# Patient Record
Sex: Female | Born: 1935
Health system: Southern US, Community
[De-identification: ages and names within clinical notes are randomized; demographics above are authoritative.]

## PROBLEM LIST (undated history)

## (undated) DIAGNOSIS — E119 Type 2 diabetes mellitus without complications: Secondary | ICD-10-CM

## (undated) DIAGNOSIS — D649 Anemia, unspecified: Secondary | ICD-10-CM

## (undated) DIAGNOSIS — I10 Essential (primary) hypertension: Secondary | ICD-10-CM

## (undated) DIAGNOSIS — K219 Gastro-esophageal reflux disease without esophagitis: Secondary | ICD-10-CM

## (undated) DIAGNOSIS — E785 Hyperlipidemia, unspecified: Secondary | ICD-10-CM

## (undated) HISTORY — PX: ABDOMINAL HYSTERECTOMY: SHX81

---

## 2011-01-23 DIAGNOSIS — K219 Gastro-esophageal reflux disease without esophagitis: Secondary | ICD-10-CM | POA: Diagnosis not present

## 2011-01-23 DIAGNOSIS — E1159 Type 2 diabetes mellitus with other circulatory complications: Secondary | ICD-10-CM | POA: Diagnosis not present

## 2011-01-23 DIAGNOSIS — I10 Essential (primary) hypertension: Secondary | ICD-10-CM | POA: Diagnosis not present

## 2011-04-24 DIAGNOSIS — E782 Mixed hyperlipidemia: Secondary | ICD-10-CM | POA: Diagnosis not present

## 2011-07-27 DIAGNOSIS — I10 Essential (primary) hypertension: Secondary | ICD-10-CM | POA: Diagnosis not present

## 2011-10-27 DIAGNOSIS — E782 Mixed hyperlipidemia: Secondary | ICD-10-CM | POA: Diagnosis not present

## 2011-10-27 DIAGNOSIS — I10 Essential (primary) hypertension: Secondary | ICD-10-CM | POA: Diagnosis not present

## 2011-10-27 DIAGNOSIS — Z7983 Long term (current) use of bisphosphonates: Secondary | ICD-10-CM | POA: Diagnosis not present

## 2011-10-27 DIAGNOSIS — IMO0001 Reserved for inherently not codable concepts without codable children: Secondary | ICD-10-CM | POA: Diagnosis not present

## 2012-01-29 DIAGNOSIS — I10 Essential (primary) hypertension: Secondary | ICD-10-CM | POA: Diagnosis not present

## 2012-04-29 DIAGNOSIS — I10 Essential (primary) hypertension: Secondary | ICD-10-CM | POA: Diagnosis not present

## 2012-04-29 DIAGNOSIS — Z136 Encounter for screening for cardiovascular disorders: Secondary | ICD-10-CM | POA: Diagnosis not present

## 2012-04-29 DIAGNOSIS — Z0000 Encounter for general adult medical examination without abnormal findings: Secondary | ICD-10-CM | POA: Diagnosis not present

## 2012-07-29 DIAGNOSIS — E1159 Type 2 diabetes mellitus with other circulatory complications: Secondary | ICD-10-CM | POA: Diagnosis not present

## 2012-07-29 DIAGNOSIS — I10 Essential (primary) hypertension: Secondary | ICD-10-CM | POA: Diagnosis not present

## 2012-08-02 DIAGNOSIS — E1100 Type 2 diabetes mellitus with hyperosmolarity without nonketotic hyperglycemic-hyperosmolar coma (NKHHC): Secondary | ICD-10-CM | POA: Diagnosis not present

## 2012-08-02 DIAGNOSIS — Z961 Presence of intraocular lens: Secondary | ICD-10-CM | POA: Diagnosis not present

## 2012-08-02 DIAGNOSIS — H524 Presbyopia: Secondary | ICD-10-CM | POA: Diagnosis not present

## 2012-10-29 DIAGNOSIS — N19 Unspecified kidney failure: Secondary | ICD-10-CM | POA: Diagnosis not present

## 2012-10-29 DIAGNOSIS — I10 Essential (primary) hypertension: Secondary | ICD-10-CM | POA: Diagnosis not present

## 2012-10-29 DIAGNOSIS — Z136 Encounter for screening for cardiovascular disorders: Secondary | ICD-10-CM | POA: Diagnosis not present

## 2013-01-31 DIAGNOSIS — I10 Essential (primary) hypertension: Secondary | ICD-10-CM | POA: Diagnosis not present

## 2013-05-06 DIAGNOSIS — Z0000 Encounter for general adult medical examination without abnormal findings: Secondary | ICD-10-CM | POA: Diagnosis not present

## 2013-05-06 DIAGNOSIS — I10 Essential (primary) hypertension: Secondary | ICD-10-CM | POA: Diagnosis not present

## 2013-05-06 DIAGNOSIS — IMO0001 Reserved for inherently not codable concepts without codable children: Secondary | ICD-10-CM | POA: Diagnosis not present

## 2013-05-06 DIAGNOSIS — A045 Campylobacter enteritis: Secondary | ICD-10-CM | POA: Diagnosis not present

## 2013-08-04 ENCOUNTER — Encounter (HOSPITAL_COMMUNITY): Payer: Self-pay | Admitting: *Deleted

## 2013-08-04 ENCOUNTER — Inpatient Hospital Stay (HOSPITAL_COMMUNITY)
Admission: AD | Admit: 2013-08-04 | Discharge: 2013-08-08 | DRG: 481 | Disposition: A | Discharge status: Skilled Nursing Facility | Payer: Medicare Other | Source: Other Acute Inpatient Hospital | Attending: Internal Medicine | Admitting: Internal Medicine

## 2013-08-04 ENCOUNTER — Inpatient Hospital Stay (HOSPITAL_COMMUNITY): Payer: Medicare Other

## 2013-08-04 ENCOUNTER — Encounter (HOSPITAL_COMMUNITY): Payer: Self-pay | Admitting: Anesthesiology

## 2013-08-04 DIAGNOSIS — R6889 Other general symptoms and signs: Secondary | ICD-10-CM | POA: Diagnosis not present

## 2013-08-04 DIAGNOSIS — M25559 Pain in unspecified hip: Secondary | ICD-10-CM | POA: Diagnosis not present

## 2013-08-04 DIAGNOSIS — N181 Chronic kidney disease, stage 1: Secondary | ICD-10-CM | POA: Diagnosis present

## 2013-08-04 DIAGNOSIS — N958 Other specified menopausal and perimenopausal disorders: Secondary | ICD-10-CM | POA: Diagnosis not present

## 2013-08-04 DIAGNOSIS — Z90710 Acquired absence of both cervix and uterus: Secondary | ICD-10-CM | POA: Diagnosis not present

## 2013-08-04 DIAGNOSIS — M79609 Pain in unspecified limb: Secondary | ICD-10-CM | POA: Diagnosis not present

## 2013-08-04 DIAGNOSIS — I10 Essential (primary) hypertension: Secondary | ICD-10-CM | POA: Diagnosis not present

## 2013-08-04 DIAGNOSIS — Z853 Personal history of malignant neoplasm of breast: Secondary | ICD-10-CM | POA: Diagnosis not present

## 2013-08-04 DIAGNOSIS — IMO0001 Reserved for inherently not codable concepts without codable children: Secondary | ICD-10-CM | POA: Diagnosis not present

## 2013-08-04 DIAGNOSIS — W010XXA Fall on same level from slipping, tripping and stumbling without subsequent striking against object, initial encounter: Secondary | ICD-10-CM | POA: Diagnosis not present

## 2013-08-04 DIAGNOSIS — Z01818 Encounter for other preprocedural examination: Secondary | ICD-10-CM | POA: Diagnosis not present

## 2013-08-04 DIAGNOSIS — Y92009 Unspecified place in unspecified non-institutional (private) residence as the place of occurrence of the external cause: Secondary | ICD-10-CM

## 2013-08-04 DIAGNOSIS — S72143A Displaced intertrochanteric fracture of unspecified femur, initial encounter for closed fracture: Principal | ICD-10-CM

## 2013-08-04 DIAGNOSIS — M25569 Pain in unspecified knee: Secondary | ICD-10-CM | POA: Diagnosis not present

## 2013-08-04 DIAGNOSIS — D62 Acute posthemorrhagic anemia: Secondary | ICD-10-CM | POA: Diagnosis not present

## 2013-08-04 DIAGNOSIS — E785 Hyperlipidemia, unspecified: Secondary | ICD-10-CM | POA: Diagnosis not present

## 2013-08-04 DIAGNOSIS — K5909 Other constipation: Secondary | ICD-10-CM | POA: Diagnosis present

## 2013-08-04 DIAGNOSIS — Z9181 History of falling: Secondary | ICD-10-CM | POA: Diagnosis not present

## 2013-08-04 DIAGNOSIS — E876 Hypokalemia: Secondary | ICD-10-CM | POA: Diagnosis not present

## 2013-08-04 DIAGNOSIS — IMO0002 Reserved for concepts with insufficient information to code with codable children: Secondary | ICD-10-CM | POA: Diagnosis not present

## 2013-08-04 DIAGNOSIS — I129 Hypertensive chronic kidney disease with stage 1 through stage 4 chronic kidney disease, or unspecified chronic kidney disease: Secondary | ICD-10-CM | POA: Diagnosis present

## 2013-08-04 DIAGNOSIS — R279 Unspecified lack of coordination: Secondary | ICD-10-CM | POA: Diagnosis not present

## 2013-08-04 DIAGNOSIS — K219 Gastro-esophageal reflux disease without esophagitis: Secondary | ICD-10-CM | POA: Diagnosis present

## 2013-08-04 DIAGNOSIS — D649 Anemia, unspecified: Secondary | ICD-10-CM | POA: Diagnosis not present

## 2013-08-04 DIAGNOSIS — E119 Type 2 diabetes mellitus without complications: Secondary | ICD-10-CM | POA: Diagnosis present

## 2013-08-04 DIAGNOSIS — T4275XA Adverse effect of unspecified antiepileptic and sedative-hypnotic drugs, initial encounter: Secondary | ICD-10-CM | POA: Diagnosis present

## 2013-08-04 DIAGNOSIS — Z96649 Presence of unspecified artificial hip joint: Secondary | ICD-10-CM | POA: Diagnosis not present

## 2013-08-04 DIAGNOSIS — Z7401 Bed confinement status: Secondary | ICD-10-CM | POA: Diagnosis not present

## 2013-08-04 DIAGNOSIS — E139 Other specified diabetes mellitus without complications: Secondary | ICD-10-CM | POA: Diagnosis not present

## 2013-08-04 DIAGNOSIS — E089 Diabetes mellitus due to underlying condition without complications: Secondary | ICD-10-CM

## 2013-08-04 DIAGNOSIS — M2550 Pain in unspecified joint: Secondary | ICD-10-CM | POA: Diagnosis not present

## 2013-08-04 DIAGNOSIS — S72142A Displaced intertrochanteric fracture of left femur, initial encounter for closed fracture: Secondary | ICD-10-CM

## 2013-08-04 DIAGNOSIS — S72409A Unspecified fracture of lower end of unspecified femur, initial encounter for closed fracture: Secondary | ICD-10-CM | POA: Diagnosis not present

## 2013-08-04 DIAGNOSIS — N19 Unspecified kidney failure: Secondary | ICD-10-CM | POA: Diagnosis not present

## 2013-08-04 DIAGNOSIS — S72009A Fracture of unspecified part of neck of unspecified femur, initial encounter for closed fracture: Secondary | ICD-10-CM

## 2013-08-04 DIAGNOSIS — N189 Chronic kidney disease, unspecified: Secondary | ICD-10-CM | POA: Diagnosis not present

## 2013-08-04 DIAGNOSIS — K469 Unspecified abdominal hernia without obstruction or gangrene: Secondary | ICD-10-CM | POA: Diagnosis not present

## 2013-08-04 HISTORY — DX: Type 2 diabetes mellitus without complications: E11.9

## 2013-08-04 HISTORY — DX: Essential (primary) hypertension: I10

## 2013-08-04 LAB — BASIC METABOLIC PANEL
ANION GAP: 16 — AB (ref 5–15)
BUN: 25 mg/dL — AB (ref 6–23)
CO2: 26 mEq/L (ref 19–32)
Calcium: 9.8 mg/dL (ref 8.4–10.5)
Chloride: 98 mEq/L (ref 96–112)
Creatinine, Ser: 1.05 mg/dL (ref 0.50–1.10)
GFR, EST AFRICAN AMERICAN: 59 mL/min — AB (ref >90)
GFR, EST NON AFRICAN AMERICAN: 51 mL/min — AB (ref >90)
Glucose, Bld: 197 mg/dL — ABNORMAL HIGH (ref 70–99)
POTASSIUM: 3.8 meq/L (ref 3.7–5.3)
SODIUM: 140 meq/L (ref 137–147)

## 2013-08-04 LAB — CBC
HCT: 37.6 % (ref 36.0–46.0)
Hemoglobin: 12.7 g/dL (ref 12.0–15.0)
MCH: 28.3 pg (ref 26.0–34.0)
MCHC: 33.8 g/dL (ref 30.0–36.0)
MCV: 83.7 fL (ref 78.0–100.0)
Platelets: 266 10*3/uL (ref 150–400)
RBC: 4.49 MIL/uL (ref 3.87–5.11)
RDW: 14.1 % (ref 11.5–15.5)
WBC: 10.4 10*3/uL (ref 4.0–10.5)

## 2013-08-04 LAB — GLUCOSE, CAPILLARY: Glucose-Capillary: 193 mg/dL — ABNORMAL HIGH (ref 70–99)

## 2013-08-04 LAB — PREPARE RBC (CROSSMATCH)

## 2013-08-04 LAB — SURGICAL PCR SCREEN
MRSA, PCR: NEGATIVE
STAPHYLOCOCCUS AUREUS: NEGATIVE

## 2013-08-04 LAB — ABO/RH: ABO/RH(D): AB POS

## 2013-08-04 MED ORDER — NALOXONE HCL 0.4 MG/ML IJ SOLN: 0.4000 mg | INTRAMUSCULAR | Status: DC | PRN

## 2013-08-04 MED ORDER — ATENOLOL 25 MG PO TABS
50.0000 mg | ORAL_TABLET | Freq: Every day | ORAL | Status: DC
  Administered 2013-08-04 – 2013-08-08 (×5): 50 mg via ORAL
  Filled 2013-08-04 (×5): qty 2

## 2013-08-04 MED ORDER — CHLORTHALIDONE 25 MG PO TABS
25.0000 mg | ORAL_TABLET | Freq: Every day | ORAL | Status: DC
  Administered 2013-08-04: 25 mg via ORAL
  Filled 2013-08-04 (×3): qty 1

## 2013-08-04 MED ORDER — POVIDONE-IODINE 10 % EX SOLN
CUTANEOUS | Status: AC
  Filled 2013-08-04: qty 118

## 2013-08-04 MED ORDER — REPAGLINIDE 0.5 MG PO TABS
2.0000 mg | ORAL_TABLET | Freq: Two times a day (BID) | ORAL | Status: DC
  Administered 2013-08-04 – 2013-08-05 (×2): 2 mg via ORAL
  Filled 2013-08-04: qty 1
  Filled 2013-08-04: qty 4
  Filled 2013-08-04 (×2): qty 1
  Filled 2013-08-04 (×2): qty 4
  Filled 2013-08-04: qty 1

## 2013-08-04 MED ORDER — HEPARIN SODIUM (PORCINE) 5000 UNIT/ML IJ SOLN
5000.0000 [IU] | Freq: Three times a day (TID) | INTRAMUSCULAR | Status: DC
  Administered 2013-08-04: 5000 [IU] via SUBCUTANEOUS
  Filled 2013-08-04 (×2): qty 1

## 2013-08-04 MED ORDER — INSULIN ASPART 100 UNIT/ML ~~LOC~~ SOLN: 0.0000 [IU] | Freq: Three times a day (TID) | SUBCUTANEOUS | Status: DC

## 2013-08-04 MED ORDER — SODIUM CHLORIDE 0.9 % IV SOLN
INTRAVENOUS | Status: DC
  Administered 2013-08-04 – 2013-08-07 (×4): via INTRAVENOUS

## 2013-08-04 MED ORDER — ONDANSETRON HCL 4 MG/2ML IJ SOLN: 4.0000 mg | Freq: Four times a day (QID) | INTRAMUSCULAR | Status: DC | PRN

## 2013-08-04 MED ORDER — AMLODIPINE BESYLATE 5 MG PO TABS
10.0000 mg | ORAL_TABLET | Freq: Every day | ORAL | Status: DC
  Administered 2013-08-04 – 2013-08-08 (×5): 10 mg via ORAL
  Filled 2013-08-04 (×4): qty 2

## 2013-08-04 MED ORDER — FAMOTIDINE 20 MG PO TABS
20.0000 mg | ORAL_TABLET | Freq: Every day | ORAL | Status: DC
  Administered 2013-08-04 – 2013-08-05 (×2): 20 mg via ORAL
  Filled 2013-08-04 (×2): qty 1

## 2013-08-04 MED ORDER — CEFAZOLIN SODIUM-DEXTROSE 2-3 GM-% IV SOLR
2.0000 g | Freq: Once | INTRAVENOUS | Status: AC
  Administered 2013-08-05: 2 g via INTRAVENOUS
  Filled 2013-08-04: qty 50

## 2013-08-04 MED ORDER — ATENOLOL-CHLORTHALIDONE 50-25 MG PO TABS: 1.0000 | ORAL_TABLET | Freq: Every day | ORAL | Status: DC

## 2013-08-04 MED ORDER — METFORMIN HCL 500 MG PO TABS
500.0000 mg | ORAL_TABLET | Freq: Two times a day (BID) | ORAL | Status: DC
  Administered 2013-08-04 – 2013-08-05 (×2): 500 mg via ORAL
  Filled 2013-08-04: qty 1

## 2013-08-04 MED ORDER — DIPHENHYDRAMINE HCL 50 MG/ML IJ SOLN: 12.5000 mg | Freq: Four times a day (QID) | INTRAMUSCULAR | Status: DC | PRN

## 2013-08-04 MED ORDER — ATORVASTATIN CALCIUM 20 MG PO TABS
20.0000 mg | ORAL_TABLET | Freq: Every day | ORAL | Status: DC
  Administered 2013-08-04 – 2013-08-07 (×3): 20 mg via ORAL
  Filled 2013-08-04 (×3): qty 1

## 2013-08-04 MED ORDER — POVIDONE-IODINE 10 % EX SOLN
Freq: Once | CUTANEOUS | Status: AC
  Administered 2013-08-04: 22:00:00 via TOPICAL
  Filled 2013-08-04: qty 118

## 2013-08-04 MED ORDER — CHLORTHALIDONE 25 MG PO TABS
ORAL_TABLET | ORAL | Status: AC
  Filled 2013-08-04: qty 1

## 2013-08-04 MED ORDER — SODIUM CHLORIDE 0.9 % IJ SOLN: 9.0000 mL | INTRAMUSCULAR | Status: DC | PRN

## 2013-08-04 MED ORDER — MORPHINE SULFATE (PF) 1 MG/ML IV SOLN
INTRAVENOUS | Status: DC
  Administered 2013-08-04: 1 mg via INTRAVENOUS
  Administered 2013-08-05: 4.5 mg via INTRAVENOUS
  Administered 2013-08-06: 1.5 mg via INTRAVENOUS
  Administered 2013-08-06: via INTRAVENOUS
  Administered 2013-08-06: 4.5 mg via INTRAVENOUS
  Filled 2013-08-04 (×2): qty 25

## 2013-08-04 MED ORDER — INSULIN ASPART 100 UNIT/ML ~~LOC~~ SOLN: 0.0000 [IU] | Freq: Every day | SUBCUTANEOUS | Status: DC

## 2013-08-04 MED ORDER — ONDANSETRON HCL 4 MG PO TABS: 4.0000 mg | ORAL_TABLET | Freq: Four times a day (QID) | ORAL | Status: DC | PRN

## 2013-08-04 MED ORDER — REPAGLINIDE 0.5 MG PO TABS
ORAL_TABLET | ORAL | Status: AC
  Filled 2013-08-04: qty 4

## 2013-08-04 MED ORDER — DIPHENHYDRAMINE HCL 12.5 MG/5ML PO ELIX: 12.5000 mg | ORAL_SOLUTION | Freq: Four times a day (QID) | ORAL | Status: DC | PRN

## 2013-08-04 MED ORDER — FUROSEMIDE 20 MG PO TABS
20.0000 mg | ORAL_TABLET | Freq: Every day | ORAL | Status: DC
  Administered 2013-08-04 – 2013-08-08 (×5): 20 mg via ORAL
  Filled 2013-08-04 (×5): qty 1

## 2013-08-04 NOTE — H&P (Signed)
[facility] History and Physical  Isabella Morris WUJ:[id number] DOB: Apr 26, 1935 DOA: 08/04/2013  Referring physician: Direct  admission from [facility] emergency room. PCP: No primary provider on file.   Chief Complaint: Left hip pain.  HPI: Isabella Morris is a 78 y.o. female  This is a 78 year old reasonably healthy lady who has hypertension and diabetes who presents with an accidental fall that occurred this morning approximately 10 AM. She slipped on wet concrete floor. She did not have any head injury. There was no loss of consciousness. She went to the emergency room and was found to have a left hip fracture. Unfortunately, we didn't have any medical records. She was transferred to this hospital for further management. She does not give a history of chest pain on exertion or at rest. She has no history of dyspnea. She's never had a myocardial infarction or stroke.   Review of Systems:  Constitutional:  No weight loss, night sweats, Fevers, chills, fatigue.  HEENT:  No headaches, Difficulty swallowing,Tooth/dental problems,Sore throat,  No sneezing, itching, ear ache, nasal congestion, post nasal drip,  Cardio-vascular:  No chest pain, Orthopnea, PND, swelling in lower extremities, anasarca, dizziness, palpitations  GI:  No heartburn, indigestion, abdominal pain, nausea, vomiting, diarrhea, change in bowel habits, loss of appetite  Resp:  No shortness of breath with exertion or at rest. No excess mucus, no productive cough, No non-productive cough, No coughing up of blood.No change in color of mucus.No wheezing.No chest wall deformity  Skin:  no rash or lesions.  GU:  no dysuria, change in color of urine, no urgency or frequency. No flank pain.    Psych:  No change in mood or affect. No depression or anxiety. No memory loss.   Past Medical History  Diagnosis Date  . Hypertension   . Diabetes mellitus without complication    Past Surgical History  Procedure  Laterality Date  . Abdominal hysterectomy     Social History:  reports that she has never smoked. She does not have any smokeless tobacco history on file. She reports that she does not drink alcohol or use illicit drugs.  Not on File  History reviewed. No pertinent family history.   Prior to Admission medications   Medication Sig Start Date End Date Taking? Authorizing Provider  amLODipine (NORVASC) 10 MG tablet Take 10 mg by mouth daily. 07/09/13   Historical Provider, MD  atenolol-chlorthalidone (TENORETIC) 50-25 MG per tablet Take 1 tablet by mouth daily. 08/04/13   Historical Provider, MD  atorvastatin (LIPITOR) 20 MG tablet Take 20 mg by mouth daily. 05/30/13   Historical Provider, MD  furosemide (LASIX) 20 MG tablet Take 20 mg by mouth daily. 08/04/13   Historical Provider, MD  metFORMIN (GLUCOPHAGE) 500 MG tablet Take 500 mg by mouth 2 (two) times daily. 05/08/13   Historical Provider, MD  ranitidine (ZANTAC) 150 MG tablet Take 150 mg by mouth 2 (two) times daily. 05/08/13   Historical Provider, MD  repaglinide (PRANDIN) 2 MG tablet Take 2 mg by mouth 2 (two) times daily. 05/19/13   Historical Provider, MD   Physical Exam: Filed Vitals:   08/04/13 1626 08/04/13 1627 08/04/13 1650  BP: 138/61 138/61   Pulse: 73 73   Temp: 99.3 F (37.4 C) 99.3 F (37.4 C)   TempSrc: Oral Oral   Height:  5\' 7"  (1.702 m)   Weight:  78.472 kg (173 lb) 78.472 kg (173 lb)  SpO2: 96% 96%     Wt Readings from Last 3  Encounters:  08/04/13 78.472 kg (173 lb)    General:  Appears calm and comfortable Eyes: PERRL, normal lids, irises & conjunctiva ENT: grossly normal hearing, lips & tongue Neck: no LAD, masses or thyromegaly Cardiovascular: RRR, no m/r/g. No LE edema. Telemetry: SR, no arrhythmias  Respiratory: CTA bilaterally, no w/r/r. Normal respiratory effort. Abdomen: soft, ntnd Skin: no rash or induration seen on limited exam Musculoskeletal: Slightly shortened left lower leg. Psychiatric:  grossly normal mood and affect, speech fluent and appropriate Neurologic: grossly non-focal.              Assessment/Plan   1. Left hip fracture. 2. Hypertension. 3. Diabetes mellitus.  Plan: 1. Admit to medical floor. 2. Analgesia as required. 3. Orthopedic consultation is appreciated. 4. Monitor and control diabetes and hypertension.  Further recommendations will depend on patient's hospital progress.   Code Status: Full code.  DVT Prophylaxis: Heparin.  Family Communication: I discussed the plan with patient at the bedside.   Disposition Plan: Depending on progress. Will likely need skilled nursing facility for rehabilitation.  Time spent: 60 minutes.  Wilson SingerGOSRANI,Mariyana Fulop C [facility] Pager [phone].  **Disclaimer: This note may have been dictated with voice recognition software. Similar sounding words can inadvertently be transcribed and this note may contain transcription errors which may not have been corrected upon publication of note.**

## 2013-08-04 NOTE — Consult Note (Signed)
Reason for Consult:fracture of the left hip Referring Physician: Hosptialist  Isabella Morris is an 78 y.o. female.  HPI: She fell in her yard today watering plants.  She hurt her left hip.  She was unable to bear weight.  [facility].  CT scan shows a fracture of the greater trochanter comminuted with possible extension to lesser trochanter.  MRI recommended.  I will obtain MRI tonight.  [facility] had no orthopaedic coverage.  Patient asked to be transferred here.  Her daughter works in Patent attorneyx-ray department.  She has no other injury.  I have explained to her about possible hip surgery, risks and imponderables.  I await MRI to be done but if she has an intertrochanteric fracture she will need surgery.  I have explained about need for antibiotics, possible blood transfusion, need for anticoagulation and possible embolus which could kill, need for physical therapy and walker, post hospital therapy and rehabilitation, and possible anesthesia problems.  I would recommend spinal anesthesia.  She appeared to understand and agrees to procedure.  As stated MRI pending. I have written orders and tentatively scheduled for surgery in the morning.   Past Medical History  Diagnosis Date  . Hypertension   . Diabetes mellitus without complication     Past Surgical History  Procedure Laterality Date  . Abdominal hysterectomy      History reviewed. No pertinent family history.  Social History:  reports that she has never smoked. She does not have any smokeless tobacco history on file. She reports that she does not drink alcohol or use illicit drugs.  Allergies: Not on File  Medications: I have reviewed the patient's current medications.  No results found for this or any previous visit (from the past 48 hour(s)).  No results found.  Review of Systems  Eyes:       History of cataract surgery.  Musculoskeletal: Positive for falls (Fell at home watering plants today.  Hurt left hip.  No  other injury.).  Endo/Heme/Allergies:       History of breast cancer.  History of hysterectomy and appendectomy.   Blood pressure 138/61, pulse 73, temperature 99.3 F (37.4 C), temperature source Oral, height 5\' 7"  (1.702 m), weight 78.472 kg (173 lb), SpO2 96.00%. Physical Exam  Constitutional: She is oriented to person, place, and time. She appears well-developed and well-nourished.  HENT:  Head: Normocephalic and atraumatic.  Eyes: EOM are normal. Pupils are equal, round, and reactive to light.  Neck: Normal range of motion. Neck supple.  Cardiovascular: Normal rate, regular rhythm and intact distal pulses.   Respiratory: Effort normal.  Musculoskeletal: She exhibits tenderness (Pain left hip with any motion.  No shortening or external rotation.  NV is intact.).       Legs: Neurological: She is alert and oriented to person, place, and time. She has normal reflexes.  Skin: Skin is warm and dry.  Psychiatric: She has a normal mood and affect. Her behavior is normal. Judgment and thought content normal.    Assessment/Plan: Fracture of the left greater trochanter with possible extension to lessor trochanter, MRI scheduled.  May need surgery on the left hip.  Keep at bed rest and pain medicine tonight.  Isabella Morris 08/04/2013, 5:08 PM

## 2013-08-05 ENCOUNTER — Inpatient Hospital Stay (HOSPITAL_COMMUNITY): Payer: Medicare Other

## 2013-08-05 ENCOUNTER — Encounter (HOSPITAL_COMMUNITY)
Admission: AD | Disposition: A | Discharge status: Skilled Nursing Facility | Payer: Self-pay | Source: Other Acute Inpatient Hospital | Attending: Family Medicine

## 2013-08-05 ENCOUNTER — Encounter (HOSPITAL_COMMUNITY): Payer: Self-pay | Admitting: *Deleted

## 2013-08-05 ENCOUNTER — Encounter (HOSPITAL_COMMUNITY): Payer: Medicare Other | Admitting: Anesthesiology

## 2013-08-05 ENCOUNTER — Inpatient Hospital Stay (HOSPITAL_COMMUNITY): Payer: Medicare Other | Admitting: Anesthesiology

## 2013-08-05 HISTORY — PX: ORIF HIP FRACTURE: SHX2125

## 2013-08-05 LAB — CBC WITH DIFFERENTIAL/PLATELET
BASOS PCT: 0 % (ref 0–1)
Basophils Absolute: 0 10*3/uL (ref 0.0–0.1)
EOS PCT: 3 % (ref 0–5)
Eosinophils Absolute: 0.2 10*3/uL (ref 0.0–0.7)
HEMATOCRIT: 36.7 % (ref 36.0–46.0)
HEMOGLOBIN: 12.3 g/dL (ref 12.0–15.0)
Lymphocytes Relative: 15 % (ref 12–46)
Lymphs Abs: 1.2 10*3/uL (ref 0.7–4.0)
MCH: 28.3 pg (ref 26.0–34.0)
MCHC: 33.5 g/dL (ref 30.0–36.0)
MCV: 84.4 fL (ref 78.0–100.0)
MONO ABS: 0.6 10*3/uL (ref 0.1–1.0)
MONOS PCT: 8 % (ref 3–12)
NEUTROS ABS: 5.8 10*3/uL (ref 1.7–7.7)
Neutrophils Relative %: 74 % (ref 43–77)
Platelets: 241 10*3/uL (ref 150–400)
RBC: 4.35 MIL/uL (ref 3.87–5.11)
RDW: 14.2 % (ref 11.5–15.5)
WBC: 7.9 10*3/uL (ref 4.0–10.5)

## 2013-08-05 LAB — COMPREHENSIVE METABOLIC PANEL
ALT: 23 U/L (ref 0–35)
ANION GAP: 14 (ref 5–15)
AST: 39 U/L — ABNORMAL HIGH (ref 0–37)
Albumin: 3.5 g/dL (ref 3.5–5.2)
Alkaline Phosphatase: 48 U/L (ref 39–117)
BUN: 23 mg/dL (ref 6–23)
CO2: 28 mEq/L (ref 19–32)
CREATININE: 1.08 mg/dL (ref 0.50–1.10)
Calcium: 9.2 mg/dL (ref 8.4–10.5)
Chloride: 98 mEq/L (ref 96–112)
GFR calc Af Amer: 57 mL/min — ABNORMAL LOW (ref >90)
GFR, EST NON AFRICAN AMERICAN: 49 mL/min — AB (ref >90)
GLUCOSE: 164 mg/dL — AB (ref 70–99)
Potassium: 3.3 mEq/L — ABNORMAL LOW (ref 3.7–5.3)
SODIUM: 140 meq/L (ref 137–147)
TOTAL PROTEIN: 6.7 g/dL (ref 6.0–8.3)
Total Bilirubin: 0.9 mg/dL (ref 0.3–1.2)

## 2013-08-05 LAB — GLUCOSE, CAPILLARY
GLUCOSE-CAPILLARY: 165 mg/dL — AB (ref 70–99)
GLUCOSE-CAPILLARY: 144 mg/dL — AB (ref 70–99)
Glucose-Capillary: 158 mg/dL — ABNORMAL HIGH (ref 70–99)

## 2013-08-05 SURGERY — OPEN REDUCTION INTERNAL FIXATION HIP
Anesthesia: Spinal | Site: Hip | Laterality: Left

## 2013-08-05 MED ORDER — LACTATED RINGERS IV SOLN
INTRAVENOUS | Status: DC
  Administered 2013-08-05: 15:00:00 via INTRAVENOUS

## 2013-08-05 MED ORDER — CHROMIUM-CINNAMON 50-500 MCG-MG PO CAPS: 2.0000 | ORAL_CAPSULE | Freq: Every morning | ORAL | Status: DC

## 2013-08-05 MED ORDER — PHENYLEPHRINE HCL 10 MG/ML IJ SOLN
INTRAMUSCULAR | Status: DC | PRN
  Administered 2013-08-05: 100 ug via INTRAVENOUS

## 2013-08-05 MED ORDER — INSULIN ASPART 100 UNIT/ML ~~LOC~~ SOLN
0.0000 [IU] | SUBCUTANEOUS | Status: DC
  Administered 2013-08-06 (×2): 2 [IU] via SUBCUTANEOUS

## 2013-08-05 MED ORDER — FENTANYL CITRATE 0.05 MG/ML IJ SOLN
INTRAMUSCULAR | Status: DC | PRN
  Administered 2013-08-05: 50 ug via INTRAVENOUS
  Administered 2013-08-05 (×8): 25 ug via INTRAVENOUS
  Administered 2013-08-05: 20 ug via INTRATHECAL
  Administered 2013-08-05: 30 ug via INTRAVENOUS

## 2013-08-05 MED ORDER — CALCIUM CARBONATE-VITAMIN D 500-200 MG-UNIT PO TABS
1.0000 | ORAL_TABLET | Freq: Two times a day (BID) | ORAL | Status: DC
  Administered 2013-08-06 – 2013-08-08 (×6): 1 via ORAL
  Filled 2013-08-05 (×15): qty 1

## 2013-08-05 MED ORDER — SODIUM BICARBONATE 650 MG PO TABS
650.0000 mg | ORAL_TABLET | Freq: Two times a day (BID) | ORAL | Status: DC
  Administered 2013-08-06 – 2013-08-07 (×3): 650 mg via ORAL
  Filled 2013-08-05 (×3): qty 1

## 2013-08-05 MED ORDER — BUPIVACAINE IN DEXTROSE 0.75-8.25 % IT SOLN
INTRATHECAL | Status: AC
  Filled 2013-08-05: qty 2

## 2013-08-05 MED ORDER — CHOLECALCIFEROL 10 MCG (400 UNIT) PO TABS
400.0000 [IU] | ORAL_TABLET | Freq: Every day | ORAL | Status: DC
  Administered 2013-08-06 – 2013-08-08 (×3): 400 [IU] via ORAL
  Filled 2013-08-05 (×3): qty 1

## 2013-08-05 MED ORDER — CALCIUM CARBONATE 1250 (500 CA) MG PO TABS
1250.0000 mg | ORAL_TABLET | Freq: Two times a day (BID) | ORAL | Status: DC
  Administered 2013-08-06 – 2013-08-07 (×4): 1250 mg via ORAL
  Filled 2013-08-05 (×4): qty 3

## 2013-08-05 MED ORDER — PROMETHAZINE HCL 25 MG/ML IJ SOLN: 12.5000 mg | INTRAMUSCULAR | Status: DC | PRN

## 2013-08-05 MED ORDER — ARTIFICIAL TEARS OP OINT
TOPICAL_OINTMENT | OPHTHALMIC | Status: AC
  Filled 2013-08-05: qty 3.5

## 2013-08-05 MED ORDER — ALBUTEROL SULFATE (2.5 MG/3ML) 0.083% IN NEBU
2.5000 mg | INHALATION_SOLUTION | Freq: Four times a day (QID) | RESPIRATORY_TRACT | Status: DC
  Administered 2013-08-05 – 2013-08-06 (×3): 2.5 mg via RESPIRATORY_TRACT
  Filled 2013-08-05 (×3): qty 3

## 2013-08-05 MED ORDER — ENOXAPARIN SODIUM 40 MG/0.4ML ~~LOC~~ SOLN
40.0000 mg | SUBCUTANEOUS | Status: DC
  Administered 2013-08-06 – 2013-08-08 (×3): 40 mg via SUBCUTANEOUS
  Filled 2013-08-05 (×3): qty 0.4

## 2013-08-05 MED ORDER — MIDAZOLAM HCL 5 MG/5ML IJ SOLN
INTRAMUSCULAR | Status: DC | PRN
  Administered 2013-08-05 (×2): 1 mg via INTRAVENOUS

## 2013-08-05 MED ORDER — FENTANYL CITRATE 0.05 MG/ML IJ SOLN
INTRAMUSCULAR | Status: AC
  Filled 2013-08-05: qty 2

## 2013-08-05 MED ORDER — EPHEDRINE SULFATE 50 MG/ML IJ SOLN
INTRAMUSCULAR | Status: DC | PRN
  Administered 2013-08-05 (×3): 10 mg via INTRAVENOUS

## 2013-08-05 MED ORDER — PROPOFOL INFUSION 10 MG/ML OPTIME
INTRAVENOUS | Status: DC | PRN
  Administered 2013-08-05: 50 ug/kg/min via INTRAVENOUS

## 2013-08-05 MED ORDER — MIDAZOLAM HCL 2 MG/2ML IJ SOLN
INTRAMUSCULAR | Status: AC
  Filled 2013-08-05: qty 2

## 2013-08-05 MED ORDER — POTASSIUM CHLORIDE CRYS ER 20 MEQ PO TBCR
40.0000 meq | EXTENDED_RELEASE_TABLET | Freq: Every day | ORAL | Status: DC
  Administered 2013-08-05 – 2013-08-08 (×4): 40 meq via ORAL
  Filled 2013-08-05 (×4): qty 2

## 2013-08-05 MED ORDER — LACTATED RINGERS IV SOLN
INTRAVENOUS | Status: DC | PRN
  Administered 2013-08-05 (×2): via INTRAVENOUS

## 2013-08-05 MED ORDER — 0.9 % SODIUM CHLORIDE (POUR BTL) OPTIME
TOPICAL | Status: DC | PRN
  Administered 2013-08-05: 1000 mL

## 2013-08-05 MED ORDER — LIDOCAINE HCL (CARDIAC) 10 MG/ML IV SOLN
INTRAVENOUS | Status: DC | PRN
  Administered 2013-08-05: 50 mg via INTRAVENOUS

## 2013-08-05 MED ORDER — BUPIVACAINE HCL (PF) 0.75 % IJ SOLN
INTRAMUSCULAR | Status: DC | PRN
  Administered 2013-08-05: 13 mg via INTRATHECAL

## 2013-08-05 SURGICAL SUPPLY — 53 items
BAG HAMPER (MISCELLANEOUS) ×3 IMPLANT
BIT DRILL TWIST 3.5MM (BIT) ×1 IMPLANT
BLADE 10 SAFETY STRL DISP (BLADE) ×6 IMPLANT
BLADE SURG SZ20 CARB STEEL (BLADE) ×3 IMPLANT
CLOTH BEACON ORANGE TIMEOUT ST (SAFETY) ×3 IMPLANT
COVER LIGHT HANDLE STERIS (MISCELLANEOUS) ×6 IMPLANT
COVER MAYO STAND XLG (DRAPE) ×3 IMPLANT
DRAPE STERI IOBAN 125X83 (DRAPES) ×3 IMPLANT
DRESSING XEROFORM 5X9 (GAUZE/BANDAGES/DRESSINGS) ×3 IMPLANT
DRILL TWIST 3.5MM (BIT) ×3
DRSG PAD ABDOMINAL 8X10 ST (GAUZE/BANDAGES/DRESSINGS) ×3 IMPLANT
ELECT REM PT RETURN 9FT ADLT (ELECTROSURGICAL) ×3
ELECTRODE REM PT RTRN 9FT ADLT (ELECTROSURGICAL) ×1 IMPLANT
EVACUATOR 3/16  PVC DRAIN (DRAIN) ×2
EVACUATOR 3/16 PVC DRAIN (DRAIN) ×1 IMPLANT
GAUZE SPONGE 4X4 12PLY STRL (GAUZE/BANDAGES/DRESSINGS) ×3 IMPLANT
GAUZE SPONGE 4X4 12PLY STRL LF (GAUZE/BANDAGES/DRESSINGS) ×3 IMPLANT
GAUZE XEROFORM 5X9 LF (GAUZE/BANDAGES/DRESSINGS) ×3 IMPLANT
GLOVE BIO SURGEON STRL SZ8 (GLOVE) ×3 IMPLANT
GLOVE BIO SURGEON STRL SZ8.5 (GLOVE) ×3 IMPLANT
GLOVE BIOGEL PI IND STRL 7.0 (GLOVE) ×2 IMPLANT
GLOVE BIOGEL PI INDICATOR 7.0 (GLOVE) ×4
GLOVE ECLIPSE 7.0 STRL STRAW (GLOVE) ×3 IMPLANT
GLOVE OPTIFIT SS 6.5 STRL BRWN (GLOVE) ×3 IMPLANT
GOWN STRL REUS W/TWL LRG LVL3 (GOWN DISPOSABLE) ×6 IMPLANT
GOWN STRL REUS W/TWL XL LVL3 (GOWN DISPOSABLE) ×3 IMPLANT
GUIDE PIN CALIBRATED (PIN) ×3 IMPLANT
INST SET MAJOR BONE (KITS) ×3 IMPLANT
KIT BLADEGUARD II DBL (SET/KITS/TRAYS/PACK) ×3 IMPLANT
KIT ROOM TURNOVER AP CYSTO (KITS) ×3 IMPLANT
MANIFOLD NEPTUNE II (INSTRUMENTS) ×3 IMPLANT
MARKER SKIN DUAL TIP RULER LAB (MISCELLANEOUS) ×3 IMPLANT
NS IRRIG 1000ML POUR BTL (IV SOLUTION) ×3 IMPLANT
PACK BASIC III (CUSTOM PROCEDURE TRAY) ×2
PACK SRG BSC III STRL LF ECLPS (CUSTOM PROCEDURE TRAY) ×1 IMPLANT
PAD ABD 5X9 TENDERSORB (GAUZE/BANDAGES/DRESSINGS) ×3 IMPLANT
PAD ARMBOARD 7.5X6 YLW CONV (MISCELLANEOUS) ×3 IMPLANT
PENCIL HANDSWITCHING (ELECTRODE) ×3 IMPLANT
PLATE SHORT BARRELL 140X4 (Plate) ×3 IMPLANT
SCREW CORTICAL SFTP 4.5X38MM (Screw) ×6 IMPLANT
SCREW CORTICAL SFTP 4.5X42MM (Screw) ×3 IMPLANT
SCREW CORTICAL SFTP 4.5X44MM (Screw) ×3 IMPLANT
SCREW LAG 110MM (Screw) ×3 IMPLANT
SET BASIN LINEN APH (SET/KITS/TRAYS/PACK) ×3 IMPLANT
SPONGE DRAIN TRACH 4X4 STRL 2S (GAUZE/BANDAGES/DRESSINGS) ×3 IMPLANT
SPONGE LAP 18X18 X RAY DECT (DISPOSABLE) ×6 IMPLANT
STAPLER VISISTAT 35W (STAPLE) ×3 IMPLANT
SUT BRALON NAB BRD #1 30IN (SUTURE) ×9 IMPLANT
SUT PLAIN 2 0 XLH (SUTURE) ×6 IMPLANT
SUT SILK 0 FSL (SUTURE) ×3 IMPLANT
SYR BULB IRRIGATION 50ML (SYRINGE) ×3 IMPLANT
TAPE MEDIFIX FOAM 3 (GAUZE/BANDAGES/DRESSINGS) ×3 IMPLANT
YANKAUER SUCT 12FT TUBE ARGYLE (SUCTIONS) ×3 IMPLANT

## 2013-08-05 NOTE — Anesthesia Postprocedure Evaluation (Addendum)
  Anesthesia Post-op Note  Patient: Isabella Morris  Procedure(s) Performed: Procedure(s): OPEN REDUCTION INTERNAL FIXATION HIP (Left)  Patient Location: PACU  Anesthesia Type:Spinal  Level of Consciousness: Patient sedated; slightly confused  Airway and Oxygen Therapy: Patient Spontanous Breathing and Patient connected to nasal cannula oxygen  Post-op Pain: none  Post-op Assessment: Post-op Vital signs reviewed, Patient's Cardiovascular Status Stable, Respiratory Function Stable, Patent Airway, No signs of Nausea or vomiting and Pain level controlled  Post-op Vital Signs: Reviewed and stable  Last Vitals:  Filed Vitals:   08/05/13 1530  BP: 132/63  Pulse:   Temp:   Resp: 29    Complications: No apparent anesthesia complications

## 2013-08-05 NOTE — Clinical Social Work Placement (Signed)
    Clinical Social Work Department CLINICAL SOCIAL WORK PLACEMENT NOTE 08/08/2013  Patient:  Isabella Morris,[name]  Account Number:  [id number] Admit date:  08/04/2013  Clinical Social Worker:  Santa GeneraANNE CUNNINGHAM, CLINICAL SOCIAL WORKER  Date/time:  08/05/2013 09:00 AM  Clinical Social Work is seeking post-discharge placement for this patient at the following level of care:   SKILLED NURSING   (*CSW will update this form in Epic as items are completed)   08/05/2013  Patient/family provided with [facility] Department of Clinical Social Work's list of facilities offering this level of care within the geographic area requested by the patient (or if unable, by the patient's family).  08/05/2013  Patient/family informed of their freedom to choose among providers that offer the needed level of care, that participate in Medicare, Medicaid or managed care program needed by the patient, have an available bed and are willing to accept the patient.  08/05/2013  Patient/family informed of [facility]' ownership interest in [facility], as well as of the fact that they are under no obligation to receive care at this facility.  PASARR submitted to EDS on 08/05/2013 PASARR number received on 08/05/2013  FL2 transmitted to all facilities in geographic area requested by pt/family on  08/05/2013 FL2 transmitted to all facilities within larger geographic area on   Patient informed that his/her managed care company has contracts with or will negotiate with  certain facilities, including the following:     Patient/family informed of bed offers received:  08/06/2013 [facility] Physician recommends and patient chooses bed at    [facility] on  08/08/2013 Patient [facility] be [facility] Patient and family notified of transfer on 08/08/2013 Name of family member notified:  Oletha Blendonald Fariss  The following  physician request were entered in Epic:   Additional Comments:  Santa GeneraAnne Cunningham, LCSW Clinical Social Worker [phone])

## 2013-08-05 NOTE — Progress Notes (Signed)
Subjective: My hip hurts   Objective: Vital signs in last 24 hours: Temp:  [99.3 F (37.4 C)-99.9 F (37.7 C)] 99.9 F (37.7 C) (07/28 0700) Pulse Rate:  [58-73] 58 (07/28 0700) Resp:  [2-20] 20 (07/28 0700) BP: (138-141)/(56-61) 141/58 mmHg (07/28 0700) SpO2:  [93 %-98 %] 93 % (07/28 0700) Weight:  [78.472 kg (173 lb)-79.8 kg (175 lb 14.8 oz)] 79.8 kg (175 lb 14.8 oz) (07/28 0700)  Intake/Output from previous day:   Intake/Output this shift: Total I/O In: -  Out: 700 [Urine:700]   Recent Labs  08/04/13 1725 08/05/13 0553  HGB 12.7 12.3    Recent Labs  08/04/13 1725 08/05/13 0553  WBC 10.4 7.9  RBC 4.49 4.35  HCT 37.6 36.7  PLT 266 241    Recent Labs  08/04/13 1725 08/05/13 0553  NA 140 140  K 3.8 3.3*  CL 98 98  CO2 26 28  BUN 25* 23  CREATININE 1.05 1.08  GLUCOSE 197* 164*  CALCIUM 9.8 9.2   No results found for this basename: LABPT, INR,  in the last 72 hours  Neurologically intact Neurovascular intact Sensation intact distally Intact pulses distally Dorsiflexion/Plantar flexion intact  The MRI was done last night but I still do not have a report yet.  The radiologist who specifically reads MRI of bone will not be in the office until 8 or a little latter.  I await the report.  If the fracture extends to the lesser trochanter area I will do surgery.  Schedule will not allow this until later this afternoon.  Tomorrow's schedule is also full until later in the afternoon.  I would prefer to do surgery later today if possible.  If the fracture is limited to the greater trochanter, then I would not do surgery, thus the delay in waiting for the report.  The patient and her family understand and understand reason for delay.  Assessment/Plan: Left hip fracture, greater trochanter and possible intertrochanteric fracture.  Await x-ray report.  Await possible surgery.  Continue NPO for now.   Purvis Sidle 08/05/2013, 7:22 AM

## 2013-08-05 NOTE — Clinical Social Work Psychosocial (Signed)
Clinical Social Work Department BRIEF PSYCHOSOCIAL ASSESSMENT 08/05/2013  Patient:  Isabella Morris,Isabella Morris     Account Number:  [id number]     Admit date:  08/04/2013  Clinical Social Worker:  ,, CLINICAL SOCIAL WORKER  Date/Time:  08/05/2013 09:00 AM  Referred by:  CSW  Date Referred:  08/05/2013 Referred for  SNF Placement   Other Referral:   Interview type:  Patient Other interview type:   Also spoke w husband and daughter in room with patient consent    PSYCHOSOCIAL DATA Living Status:  HUSBAND Admitted from facility:   Level of care:   Primary support name:  Ronald Hackleman Primary support relationship to patient:  SPOUSE Degree of support available:   Lives w supportive husband, 2 daughters live locally    CURRENT CONCERNS Current Concerns  Post-Acute Placement   Other Concerns:    SOCIAL WORK ASSESSMENT / PLAN CSW met w patient at bedside, patient alert and oriented x4, awaiting surgery for hip fracture repair.  Fell while watering her flowers in her yard.  Prior to accident, patient was fully independent and lived at home w spouse. Did not use assistive devices to ambulate.  Husband supportive, wants patient home as soon as she can safely transfer to home.  Patient willing to consider SNF placement for short term rehab, prefers [facility] as it is close to her house.   Daughter and husband in room supportive of short term rehab.  CSW discussed Medicare for short term rehab including copays required.  Patient agreeable to bed search process.  Will discuss offers post surgery.   Assessment/plan status:  Psychosocial Support/Ongoing Assessment of Needs Other assessment/ plan:   Information/referral to community resources:   SNF list    PATIENT'S/FAMILY'S RESPONSE TO PLAN OF CARE: Patient prefers to return home, but understands that she may need short term rehab prior to return home.         , LCSW Clinical Social Worker [phone])  

## 2013-08-05 NOTE — Brief Op Note (Signed)
08/04/2013 - 08/05/2013  5:20 PM  PATIENT:  Isabella Morris  78 y.o. female  PRE-OPERATIVE DIAGNOSIS:  Interotrochanteric Left Hip Fracture  POST-OPERATIVE DIAGNOSIS:  Interotrochanteric Left Hip Fracture  PROCEDURE:  Procedure(s): OPEN REDUCTION INTERNAL FIXATION HIP (Left)  SURGEON:  Surgeon(s) and Role:    * Darreld McleanWayne Xsavier Seeley, MD - Primary  PHYSICIAN ASSISTANT:   ASSISTANTS: C. Annabell HowellsWrenn, RN   ANESTHESIA:   spinal  EBL:  Total I/O In: 1400 [I.V.:1400] Out: 1350 [Urine:1100; Blood:250]  BLOOD ADMINISTERED:none  DRAINS: (Large) Hemovact drain(s) in the left hip area with  Suction Open   LOCAL MEDICATIONS USED:  NONE  SPECIMEN:  No Specimen  DISPOSITION OF SPECIMEN:  N/A  COUNTS:  YES  TOURNIQUET:  * No tourniquets in log *  DICTATION: .Other Dictation: Dictation Number [id number]  PLAN OF CARE: Admit to inpatient   PATIENT DISPOSITION:  PACU - hemodynamically stable.   Delay start of Pharmacological VTE agent (>24hrs) due to surgical blood loss or risk of bleeding: no

## 2013-08-05 NOTE — Progress Notes (Signed)
Patient aggressive upon awakening. Refused to leave oxygen sensor on finger and refused to have blood sugar monitored.

## 2013-08-05 NOTE — Transfer of Care (Addendum)
Immediate Anesthesia Transfer of Care Note  Patient: Isabella Morris  Procedure(s) Performed: Procedure(s): OPEN REDUCTION INTERNAL FIXATION HIP (Left)  Patient Location: PACU  Anesthesia Type:Spinal  Level of Consciousness: sedated and slightly confused  Airway & Oxygen Therapy: Patient Spontanous Breathing and Patient connected to nasal cannula oxygen  Post-op Assessment: Report given to PACU RN and Post -op Vital signs reviewed and stable  Post vital signs: Reviewed and stable  Complications: No apparent anesthesia complications

## 2013-08-05 NOTE — Progress Notes (Signed)
Note: This document was prepared with digital dictation and possible smart phrase technology. Any transcriptional errors that result from this process are unintentional.   Isabella Morris AVW:[id number] DOB: 01/31/1935 DOA: 08/04/2013 PCP: Toma Deiters, MD  Brief narrative: 78 y/o ?, history hypertension, diabetes mellitus ty 2 on orals, Hld status post accidental fall 7/27  resulting in left hip fracture  Past medical history-As per Problem list Chart reviewed as below- review  Consultants:   Orthopedics-[facility]  Procedures:   X-rays  Antibiotics:   None   Subjective  Doing fair on PCA with pain No n/v/cp currently Surgery planned for 1600   Objective    Interim History: none  Telemetry:  non tele   Objective: Filed Vitals:   08/05/13 0100 08/05/13 0400 08/05/13 0700 08/05/13 0836  BP:   141/58   Pulse:   58   Temp:   99.9 F (37.7 C)   TempSrc:   Oral   Resp: 16 18 20 18   Height:      Weight:   79.8 kg (175 lb 14.8 oz)   SpO2: 94% 94% 93% 94%    Intake/Output Summary (Last 24 hours) at 08/05/13 1041 Last data filed at 08/05/13 0707  Gross per 24 hour  Intake      0 ml  Output    700 ml  Net   -700 ml    Exam:  General: alert eomi, ncat Cardiovascular: s1 s2 no m/r/g Respiratory: clear no aded sound Abdomen: soft, NT/ND Skin no le edema Neurointact  Data Reviewed: Basic Metabolic Panel:  Recent Labs Lab 08/04/13 1725 08/05/13 0553  NA 140 140  K 3.8 3.3*  CL 98 98  CO2 26 28  GLUCOSE 197* 164*  BUN 25* 23  CREATININE 1.05 1.08  CALCIUM 9.8 9.2   Liver Function Tests:  Recent Labs Lab 08/05/13 0553  AST 39*  ALT 23  ALKPHOS 48  BILITOT 0.9  PROT 6.7  ALBUMIN 3.5   No results found for this basename: LIPASE, AMYLASE,  in the last 168 hours No results found for this basename: AMMONIA,  in the last 168 hours CBC:  Recent Labs Lab 08/04/13 1725 08/05/13 0553  WBC 10.4 7.9  NEUTROABS  --  5.8  HGB 12.7 12.3    HCT 37.6 36.7  MCV 83.7 84.4  PLT 266 241   Cardiac Enzymes: No results found for this basename: CKTOTAL, CKMB, CKMBINDEX, TROPONINI,  in the last 168 hours BNP: No components found with this basename: POCBNP,  CBG:  Recent Labs Lab 08/04/13 2122 08/05/13 0733  GLUCAP 193* 165*    Recent Results (from the past 240 hour(s))  SURGICAL PCR SCREEN     Status: None   Collection Time    08/04/13  6:15 PM      Result Value Ref Range Status   MRSA, PCR NEGATIVE  NEGATIVE Final   Staphylococcus aureus NEGATIVE  NEGATIVE Final   Comment:            The Xpert SA Assay (FDA     approved for NASAL specimens     in patients over 31 years of age),     is one component of     a comprehensive surveillance     program.  Test performance has     been validated by [employer] for patients greater     than or equal to 45 year old.     It is not  intended     to diagnose infection nor to     guide or monitor treatment.     Studies:              All Imaging reviewed and is as per above notation   Scheduled Meds: . amLODipine  10 mg Oral Daily  . atenolol  50 mg Oral Daily  . atorvastatin  20 mg Oral q1800  .  ceFAZolin (ANCEF) IV  2 g Intravenous Once  . famotidine  20 mg Oral Daily  . furosemide  20 mg Oral Daily  . heparin  5,000 Units Subcutaneous 3 times per day  . insulin aspart  0-20 Units Subcutaneous TID WC  . insulin aspart  0-5 Units Subcutaneous QHS  . metFORMIN  500 mg Oral BID WC  . morphine   Intravenous 6 times per day  . potassium chloride  40 mEq Oral Daily  . repaglinide  2 mg Oral BID   Continuous Infusions: . sodium chloride 50 mL/hr at 08/04/13 1818     Assessment/Plan: 1. L intertrochanteric hip #-per ortho-please commnet on post op AC, Pain control and Wb capacity-thank.  CBC in am.  Await CXR piror to surgery 2. Htn-mod control on Atenolol 50, Amlodipine 10.   3. DM ty 2-on oral prandin 2 mg bid, metformin 500 bid at home which have been help.   CBG q4 hrly till surgery.  RN to place order for diabetic diet post op 4. HLd-continue Lipitor 20 daily 5. Gerd-monitor 6. Stage 1 ckd-d.c hygroton this admit. 7. Hypokalemia-replace orally kdur 40 daily  Code Status: full Family Communication:  Discussed with daughter who is Engineer, structuralxray tech here Disposition Plan: inpatient pending resolution   Pleas KochJai Tramayne Sebesta, MD  [facility] Pager [phone] 08/05/2013, 10:41 AM    LOS: 1 day

## 2013-08-05 NOTE — Anesthesia Procedure Notes (Signed)
Spinal  Patient location during procedure: OR Start time: 08/05/2013 3:55 PM Staffing CRNA/Resident: ADAMS, AMY A Preanesthetic Checklist Completed: patient identified, site marked, surgical consent, pre-op evaluation, timeout performed, IV checked, risks and benefits discussed and monitors and equipment checked Spinal Block Patient position: left lateral decubitus Prep: Betadine Patient monitoring: heart rate, cardiac monitor, continuous pulse ox and blood pressure Approach: left paramedian Location: L3-4 Injection technique: single-shot Needle Needle type: Spinocan  Needle gauge: 22 G Needle length: 9 cm Assessment Sensory level: T8 Additional Notes  ATTEMPTS:2 TRAY UJ:[id number] TRAY EXPIRATION DATE:09/2014

## 2013-08-05 NOTE — Anesthesia Preprocedure Evaluation (Addendum)
Anesthesia Evaluation  Patient identified by MRN, date of birth, ID band Patient awake    Reviewed: Allergy & Precautions, H&P , NPO status , Patient's Chart, lab work & pertinent test results, reviewed documented beta blocker date and time   History of Anesthesia Complications Negative for: history of anesthetic complications  Airway Mallampati: II TM Distance: <3 FB Neck ROM: Full    Dental  (+) Edentulous Upper, Edentulous Lower   Pulmonary  breath sounds clear to auscultation        Cardiovascular hypertension, Pt. on home beta blockers Rhythm:Regular Rate:Normal + Systolic murmurs    Neuro/Psych negative psych ROS   GI/Hepatic GERD-  Medicated and Controlled,  Endo/Other  diabetes, Type 2  Renal/GU      Musculoskeletal   Abdominal   Peds  Hematology   Anesthesia Other Findings   Reproductive/Obstetrics                         Anesthesia Physical Anesthesia Plan  ASA: III and emergent  Anesthesia Plan: Spinal   Post-op Pain Management:    Induction:   Airway Management Planned: Simple Face Mask  Additional Equipment:   Intra-op Plan:   Post-operative Plan:   Informed Consent: I have reviewed the patients History and Physical, chart, labs and discussed the procedure including the risks, benefits and alternatives for the proposed anesthesia with the patient or authorized representative who has indicated his/her understanding and acceptance.   Dental advisory given  Plan Discussed with: CRNA, Anesthesiologist and Surgeon  Anesthesia Plan Comments: (Patient agrees to spinal anesthesia with general anesthesia as back up plan)        Anesthesia Quick Evaluation

## 2013-08-06 LAB — CBC WITH DIFFERENTIAL/PLATELET
BASOS ABS: 0 10*3/uL (ref 0.0–0.1)
BASOS PCT: 0 % (ref 0–1)
Eosinophils Absolute: 0.2 10*3/uL (ref 0.0–0.7)
Eosinophils Relative: 2 % (ref 0–5)
HCT: 32.9 % — ABNORMAL LOW (ref 36.0–46.0)
Hemoglobin: 10.9 g/dL — ABNORMAL LOW (ref 12.0–15.0)
LYMPHS PCT: 14 % (ref 12–46)
Lymphs Abs: 1.2 10*3/uL (ref 0.7–4.0)
MCH: 28 pg (ref 26.0–34.0)
MCHC: 33.1 g/dL (ref 30.0–36.0)
MCV: 84.6 fL (ref 78.0–100.0)
Monocytes Absolute: 0.7 10*3/uL (ref 0.1–1.0)
Monocytes Relative: 7 % (ref 3–12)
NEUTROS ABS: 6.9 10*3/uL (ref 1.7–7.7)
Neutrophils Relative %: 77 % (ref 43–77)
PLATELETS: 200 10*3/uL (ref 150–400)
RBC: 3.89 MIL/uL (ref 3.87–5.11)
RDW: 14.3 % (ref 11.5–15.5)
WBC: 8.9 10*3/uL (ref 4.0–10.5)

## 2013-08-06 LAB — GLUCOSE, CAPILLARY
GLUCOSE-CAPILLARY: 154 mg/dL — AB (ref 70–99)
GLUCOSE-CAPILLARY: 174 mg/dL — AB (ref 70–99)
Glucose-Capillary: 180 mg/dL — ABNORMAL HIGH (ref 70–99)
Glucose-Capillary: 153 mg/dL — ABNORMAL HIGH (ref 70–99)

## 2013-08-06 LAB — BASIC METABOLIC PANEL
Anion gap: 16 — ABNORMAL HIGH (ref 5–15)
BUN: 25 mg/dL — AB (ref 6–23)
CHLORIDE: 99 meq/L (ref 96–112)
CO2: 26 mEq/L (ref 19–32)
Calcium: 8.6 mg/dL (ref 8.4–10.5)
Creatinine, Ser: 1.04 mg/dL (ref 0.50–1.10)
GFR calc Af Amer: 59 mL/min — ABNORMAL LOW (ref >90)
GFR, EST NON AFRICAN AMERICAN: 51 mL/min — AB (ref >90)
Glucose, Bld: 173 mg/dL — ABNORMAL HIGH (ref 70–99)
POTASSIUM: 3.3 meq/L — AB (ref 3.7–5.3)
SODIUM: 141 meq/L (ref 137–147)

## 2013-08-06 MED ORDER — MORPHINE SULFATE 2 MG/ML IJ SOLN: 1.0000 mg | INTRAMUSCULAR | Status: DC | PRN

## 2013-08-06 MED ORDER — HYDROGEN PEROXIDE 3 % EX SOLN
CUTANEOUS | Status: DC | PRN
  Administered 2013-08-05: 1

## 2013-08-06 MED ORDER — INSULIN ASPART 100 UNIT/ML ~~LOC~~ SOLN
0.0000 [IU] | Freq: Three times a day (TID) | SUBCUTANEOUS | Status: DC
  Administered 2013-08-06 – 2013-08-07 (×2): 2 [IU] via SUBCUTANEOUS
  Administered 2013-08-07: 1 [IU] via SUBCUTANEOUS
  Administered 2013-08-07: 2 [IU] via SUBCUTANEOUS
  Administered 2013-08-08: 3 [IU] via SUBCUTANEOUS
  Administered 2013-08-08: 2 [IU] via SUBCUTANEOUS

## 2013-08-06 MED ORDER — OXYCODONE-ACETAMINOPHEN 5-325 MG PO TABS
1.0000 | ORAL_TABLET | ORAL | Status: DC
  Administered 2013-08-06 – 2013-08-07 (×4): 1 via ORAL
  Filled 2013-08-06 (×5): qty 1

## 2013-08-06 MED ORDER — ALBUTEROL SULFATE (2.5 MG/3ML) 0.083% IN NEBU: 2.5000 mg | INHALATION_SOLUTION | Freq: Four times a day (QID) | RESPIRATORY_TRACT | Status: DC | PRN

## 2013-08-06 NOTE — Progress Notes (Signed)
Subjective: 1 Day Post-Op Procedure(s) (LRB): OPEN REDUCTION INTERNAL FIXATION HIP (Left) Patient reports pain as 5 on 0-10 scale.    Objective: Vital signs in last 24 hours: Temp:  [97.8 F (36.6 C)-99.1 F (37.3 C)] 99.1 F (37.3 C) (07/29 0433) Pulse Rate:  [62-71] 71 (07/29 0433) Resp:  [14-29] 19 (07/29 0433) BP: (116-156)/(53-87) 133/55 mmHg (07/29 0433) SpO2:  [82 %-96 %] 96 % (07/29 0657) FiO2 (%):  [92 %] 92 % (07/28 1452)  Intake/Output from previous day: 07/28 0701 - 07/29 0700 In: 2000 [I.V.:2000] Out: 1825 [Urine:1500; Drains:75; Blood:250] Intake/Output this shift:     Recent Labs  08/04/13 1725 08/05/13 0553  HGB 12.7 12.3    Recent Labs  08/04/13 1725 08/05/13 0553  WBC 10.4 7.9  RBC 4.49 4.35  HCT 37.6 36.7  PLT 266 241    Recent Labs  08/04/13 1725 08/05/13 0553  NA 140 140  K 3.8 3.3*  CL 98 98  CO2 26 28  BUN 25* 23  CREATININE 1.05 1.08  GLUCOSE 197* 164*  CALCIUM 9.8 9.2   No results found for this basename: LABPT, INR,  in the last 72 hours  Neurologically intact Neurovascular intact Sensation intact distally Intact pulses distally Dorsiflexion/Plantar flexion intact  She had a good night.  Begin PT today.  Assessment/Plan: 1 Day Post-Op Procedure(s) (LRB): OPEN REDUCTION INTERNAL FIXATION HIP (Left) Up with therapy  Isabella Morris 08/06/2013, 7:05 AM

## 2013-08-06 NOTE — Anesthesia Postprocedure Evaluation (Signed)
  Anesthesia Post-op Note  Patient: Isabella Morris  Procedure(s) Performed: * No procedures listed *  Patient Location: Nursing Unit  Anesthesia Type:Spinal  Level of Consciousness: awake  Airway and Oxygen Therapy: Patient Spontanous Breathing  Post-op Pain: moderate  Post-op Assessment: Post-op Vital signs reviewed, Patient's Cardiovascular Status Stable, Respiratory Function Stable, Patent Airway and No signs of Nausea or vomiting  Post-op Vital Signs: Reviewed and stable  Last Vitals:  Filed Vitals:   08/06/13 0957  BP: 123/56  Pulse: 79  Temp: 37.3 C  Resp: 22    Complications: No apparent anesthesia complications

## 2013-08-06 NOTE — Care Management Note (Addendum)
    Page 1 of 1   08/08/2013     11:07:51 AM CARE MANAGEMENT NOTE 08/08/2013  Patient:  Jaynee EaglesHILL,Josetta F   Account Number:  [id number]  Date Initiated:  08/06/2013  Documentation initiated by:  Sharrie RothmanBLACKWELL,TAMMY C  Subjective/Objective Assessment:   Pt admitted from home with hip fracture. Pt s/p surgery and will require SNF at discharge.     Action/Plan:   CSW to arrange discharge to facility at discharge.   Anticipated DC Date:  08/08/2013   Anticipated DC Plan:  SKILLED NURSING FACILITY  In-house referral  Clinical Social Worker      DC Planning Services  CM consult      Choice offered to / List presented to:             Status of service:  Completed, signed off Medicare Important Message given?  YES (If response is "NO", the following Medicare IM given date fields will be blank) Date Medicare IM given:  08/08/2013 Medicare IM given by:  Kathyrn SheriffHILDRESS,JESSICA Date Additional Medicare IM given:   Additional Medicare IM given by:    Discharge Disposition:  SKILLED NURSING FACILITY  Per UR Regulation:    If discussed at Long Length of Stay Meetings, dates discussed:    Comments:  08/08/2013 1100 Kathyrn SheriffJessica Childress, RN, MSN, PCCN Pt plans to discharge to SNF today. CSW making D/C arrangements. Patient and Pt's RN aware of D/C arrangements. No CM needs noted.  08/06/13 1350 Arlyss Queenammy Blackwell, RN BSN CM

## 2013-08-06 NOTE — Clinical Social Work Note (Signed)
Patient and family given bed offers [facility]  Patient wants to talk to husband and decide.  [facility] was family's first choice, patient and family aware that they must meet w chosen facility to complete preadmission paperwork.  Santa GeneraAnne Cunningham, LCSW Clinical Social Worker [phone])

## 2013-08-06 NOTE — Op Note (Signed)
NAMKathlen Morris:  Isabella Morris               ACCOUNT NO.:  [id number]  MEDICAL RECORD NO.:  [id number]  LOCATION:  A308                          FACILITY:  [facility]  PHYSICIAN:  J. Darreld McleanWayne Jorja Empie, M.D. DATE OF BIRTH:  12-Sep-1935  DATE OF PROCEDURE: DATE OF DISCHARGE:                              OPERATIVE REPORT   PREOPERATIVE DIAGNOSIS:  Intertrochanteric fracture, hip on the left.  POSTOPERATIVE DIAGNOSIS:  Intertrochanteric fracture, hip on the left.  PROCEDURE:  Open treatment, internal reduction of left hip fracture using Richards classic compression screw with 100 mm compression screw and a 140 degree short barrel side plate with screws measuring 42 mm to 38 mm.  ANESTHESIA:  Spinal.  DRAINS:  One large Hemovac drain.  ESTIMATED BLOOD LOSS:  250 mL, none replaced.  SURGEON:  J. Darreld McleanWayne Hewitt Garner, MD  ASSISTANT:  Cecile Sheererynthia Wrenn, RN  INDICATIONS:  Patient fell yesterday at home and was seen originally [facility] [place].  They had no orthopedic coverage yesterday and the physician called and asked to take her in transfer to [facility], and of course I did.  She was admitted to the Hospitalist Service, she had been seen and evaluated.  She has increased risk secondary to her age.  I went over the risks and imponderables of the procedure with her and her family.  Her daughter works as an Garment/textile technologistx-ray technologist here at the hospital and was familiar with the hip procedures as she comes in and x-rays.  Surgery was delayed until this afternoon because I had to get the results of an MRI that was done because we could not tell whether this was a greater trochanteric fracture or complete intertroch, MRI was read as an intertroch.  TREATMENT OF PROCEDURE:  The patient was seen in the holding area, identified the left hip as correct surgical site.  I placed a mark on the left hip.  The patient was brought to the operating room, given spinal anesthesia and transferred to the fracture table.   Once positioned properly on the fracture table, the C-arm fluoroscopy unit was brought in place.  Everyone had on lead aprons, lead shields, and lead badges.  X-ray showed good position and alignment.  The patient was then prepped and draped in the usual manner.  We had a time-out identifying the patient as [name], we are doing her left hip for intertrochanteric fracture of the hip and that everyone in the room knew each other.  All instrumentations were properly positioned and working.  C-arm fluoroscopy unit was working. Everyone had lead aprons, lead shields, and appropriate x-ray badges. She had received her antibiotics and blood bank had blood available if needed.  Incision was made through skin and subcutaneous tissue, tensor fascia lata, vastus lateralis.  Femoral shaft was identified.  Guide pin was placed with good AP and lateral views.  Pin measured approximately 105 mm so a 100 mm compression screw was selected.  We did a step drill and then inserted the compression screw on a 4-hole, 140 degree side plate, short barrel 4 holes.  Compression was applied to the system, putting in the screws, first screw was 44, second one was 42,  and the other two were 38.  Permanent x-rays were taken.  Hemovac was placed.  X-rays looked good.  The vastus lateralis reapproximated using a running locking #1 Bralon suture.  A Hemovac drain had been placed and sewn in with 2-0 silk suture.  Vastus lateralis was repaired as stated and then the tensor fascia lata was reapproximated using #1 interrupted figure-of- eight suture of Bralon.  Subcutaneous tissue closed in layers using 2-0 plain and skin reapproximated using skin staples.  Sterile dressings applied.  Patient tolerated the procedure well and will go to recovery in good condition.          ______________________________ [name]. Darreld Mclean, M.D.     JWK/MEDQ  D:  08/05/2013  T:  08/06/2013  Job:  [id number]

## 2013-08-06 NOTE — Progress Notes (Signed)
UR chart review completed.  

## 2013-08-06 NOTE — Evaluation (Signed)
Physical Therapy Evaluation Patient Details Name: Isabella Morris MRN: [id number] DOB: [birth year] Today's Date: 08/06/2013   History of Present Illness  This is a 78 year old reasonably healthy lady who has hypertension and diabetes who presents with an accidental fall that occurred this morning approximately 10 AM. She slipped on wet concrete floor. She did not have any head injury. There was no loss of consciousness. She went to the emergency room and was found to have a left hip fracture. Unfortunately, we didn't have any medical records. She was transferred to this hospital for further management. She does not give a history of chest pain on exertion or at rest. She has no history of dyspnea. She's never had a myocardial infarction or stroke.  Clinical Impression  Pt is a 78 year old female who presents to physical therapy after a fall resulting in a Lt hip fracture.  Pt underwent ORIF Lt hip on 08/05/13; MD orders for TTWB on the Lt.  Prior to injury, pt was (I) with all bed mobility skills, transfers, and household and community amb skills.  During evaluation, pt required mod/max assist for bed mobility skills and max assist with bed elevated and use of RW for sit <-> stand transfers.  Pt reports increased complaints of pain during bed mobility and transfers requiring encouragement to complete tasks.  Unable to assess ambulation today secondary to pain in standing; will continue to assess as able. Recommend continued PT while in the hospital to address strengthening, activity tolerance, and balance for improvement of functional mobility skills with transition to SNF at discharge.  Will defer to SNF for DME.     Follow Up Recommendations SNF    Equipment Recommendations   (Will defer to SNF)       Precautions / Restrictions Restrictions Weight Bearing Restrictions: Yes Other Position/Activity Restrictions: TTWB      Mobility  Bed Mobility Overal bed mobility: Needs Assistance Bed  Mobility: Supine to Sit;Sit to Supine     Supine to sit: Mod assist;Max assist (Mod assist LE, Max assist trunk) Sit to supine: Max assist (for LE)   General bed mobility comments: Encouragement required to complete tasks secondary to complaints of pain.   Noted increased WB on Rt to unweight Lt hip when sitting at EOB.  Pt required mod assist [facility]    Transfers Overall transfer level: Needs assistance Equipment used: Rolling walker (2 wheeled) Transfers: Sit to/from Stand Sit to Stand: From elevated surface;Max assist         General transfer comment: Pt able to maintain standing for 30 seconds prior to seated rest break.    Ambulation/Gait             General Gait Details: Unable secondary to pain once in standing.      Balance Overall balance assessment: Needs assistance Sitting-balance support: Feet supported;Single extremity supported Sitting balance-Leahy Scale: Fair     Standing balance support: Bilateral upper extremity supported;During functional activity Standing balance-Leahy Scale: Poor                               Pertinent Vitals/Pain Pain 5/10 in Lt hip prior to evaluation.  Pt pushed pain pump prior to transfer.      Home Living Family/patient expects to be discharged to:: Skilled nursing facility Living Arrangements: Spouse/significant other               Additional Comments: Pt  lives with her husband in a single story home with 2 steps without handrails to enter.  DME: handicap toilet, shower tub    Prior Function Level of Independence: Independent                  Extremity/Trunk Assessment               Lower Extremity Assessment: RLE deficits/detail;LLE deficits/detail RLE Deficits / Details: MMT hip 2+/5, knee 3+/5/ankle 3+/5 LLE Deficits / Details: MMT hip/knee not assessed due to pain.  Ankle MMT 3-/5     Communication   Communication: No difficulties  Cognition Arousal/Alertness:  Awake/alert Behavior During Therapy: WFL for tasks assessed/performed Overall Cognitive Status: Within Functional Limits for tasks assessed                         Exercises Total Joint Exercises Ankle Circles/Pumps: AROM;Both;10 reps;Supine      Assessment/Plan    PT Assessment Patient needs continued PT services  PT Diagnosis Difficulty walking;Generalized weakness;Acute pain   PT Problem List Decreased strength;Decreased knowledge of use of DME;Decreased activity tolerance;Decreased balance;Decreased safety awareness;Decreased mobility;Pain  PT Treatment Interventions Balance training;DME instruction;Gait training;Neuromuscular re-education;Functional mobility training;Therapeutic activities;Therapeutic exercise;Patient/family education   PT Goals (Current goals can be found in the Care Plan section) Acute Rehab PT Goals Patient Stated Goal: sit at EOB PT Goal Formulation: With patient Time For Goal Achievement: 08/20/13 Potential to Achieve Goals: Good    Frequency 7X/week   Barriers to discharge Inaccessible home environment         End of Session Equipment Utilized During Treatment: Gait belt;Oxygen Activity Tolerance: Patient limited by pain Patient left: in bed;with call bell/phone within reach;with bed alarm set;with family/visitor present           Time: 1610-9604 PT Time Calculation (min): 30 min   Charges:   PT Evaluation $Initial PT Evaluation Tier I: 1 Procedure PT Treatments $Self Care/Home Management: 8-22 (Educated pt on WB restrictions, techniques for use of RW, exercise program for ankle pumps, heel slides, hip ABD)        Rheana Casebolt 08/06/2013, 8:53 AM

## 2013-08-06 NOTE — Progress Notes (Signed)
Note: This document was prepared with digital dictation and possible smart phrase technology. Any transcriptional errors that result from this process are unintentional.   Isabella Morris WUJ:[id number] DOB: 11/20/1935 DOA: 08/04/2013 PCP: Toma Deiters, MD  Brief narrative: 78 y/o ?, history hypertension, diabetes mellitus ty 2 on orals, Hld status post accidental fall 7/27  resulting in left hip fracture  Past medical history-As per Problem list Chart reviewed as below- review  Consultants:   Orthopedics-[facility]  Procedures:   X-rays  Antibiotics:   None   Subjective   Rates pain as 5 to 6/10 Doesn't like nasal cannula Has not been using the PCA as much Tolerating diet fairly No stool as yet   Objective    Interim History: none  Telemetry:  non tele   Objective: Filed Vitals:   08/06/13 0845 08/06/13 0859 08/06/13 0957 08/06/13 1200  BP:   123/56   Pulse:   79   Temp:   99.1 F (37.3 C)   TempSrc:      Resp:  22 22 19   Height:      Weight:      SpO2: 96% 96% 95% 94%    Intake/Output Summary (Last 24 hours) at 08/06/13 1407 Last data filed at 08/06/13 1300  Gross per 24 hour  Intake   2420 ml  Output   1125 ml  Net   1295 ml    Exam:  General: alert eomi, ncat Cardiovascular: s1 s2 no m/r/g Respiratory: clear no aded sound Abdomen: soft, NT/ND Skin no le edema Neurointact  Data Reviewed: Basic Metabolic Panel:  Recent Labs Lab 08/04/13 1725 08/05/13 0553 08/06/13 0533  NA 140 140 141  K 3.8 3.3* 3.3*  CL 98 98 99  CO2 26 28 26   GLUCOSE 197* 164* 173*  BUN 25* 23 25*  CREATININE 1.05 1.08 1.04  CALCIUM 9.8 9.2 8.6   Liver Function Tests:  Recent Labs Lab 08/05/13 0553  AST 39*  ALT 23  ALKPHOS 48  BILITOT 0.9  PROT 6.7  ALBUMIN 3.5   No results found for this basename: LIPASE, AMYLASE,  in the last 168 hours No results found for this basename: AMMONIA,  in the last 168 hours CBC:  Recent Labs Lab  08/04/13 1725 08/05/13 0553 08/06/13 0533  WBC 10.4 7.9 8.9  NEUTROABS  --  5.8 6.9  HGB 12.7 12.3 10.9*  HCT 37.6 36.7 32.9*  MCV 83.7 84.4 84.6  PLT 266 241 200   Cardiac Enzymes: No results found for this basename: CKTOTAL, CKMB, CKMBINDEX, TROPONINI,  in the last 168 hours BNP: No components found with this basename: POCBNP,  CBG:  Recent Labs Lab 08/05/13 0733 08/05/13 1127 08/05/13 1500 08/06/13 0720 08/06/13 1120  GLUCAP 165* 144* 158* 154* 174*    Recent Results (from the past 240 hour(s))  SURGICAL PCR SCREEN     Status: None   Collection Time    08/04/13  6:15 PM      Result Value Ref Range Status   MRSA, PCR NEGATIVE  NEGATIVE Final   Staphylococcus aureus NEGATIVE  NEGATIVE Final   Comment:            The Xpert SA Assay (FDA     approved for NASAL specimens     in patients over 56 years of age),     is one component of     a comprehensive surveillance     program.  Test performance has  been validated by [facility] for patients greater     than or equal to 76 year old.     It is not intended     to diagnose infection nor to     guide or monitor treatment.     Studies:              All Imaging reviewed and is as per above notation   Scheduled Meds: . amLODipine  10 mg Oral Daily  . atenolol  50 mg Oral Daily  . atorvastatin  20 mg Oral q1800  . calcium carbonate  1,250 mg Oral BID WC  . calcium-vitamin D  1 tablet Oral BID WC  . cholecalciferol  400 Units Oral Daily  . enoxaparin (LOVENOX) injection  40 mg Subcutaneous Q24H  . furosemide  20 mg Oral Daily  . insulin aspart  0-9 Units Subcutaneous 6 times per day  . morphine   Intravenous 6 times per day  . potassium chloride  40 mEq Oral Daily  . sodium bicarbonate  650 mg Oral BID   Continuous Infusions: . sodium chloride 50 mL/hr at 08/06/13 0500     Assessment/Plan:  1. L intertrochanteric hip #-per ortho-have discontinued PCA.  Patient still has drain as well as Foley and  patient states Dr. Hilda LiasKeeling is planning to withdraw both in the a.m., I will place her on aspirin 325 mg for secondary prophylaxis of DVT. Weightbearing precautions as per orthopedics 2. Anemia of acute loss- as her hemoglobin has dropped from 12->10.9. Monitor in a.m. 3. Htn-mod control on Atenolol 50, Amlodipine 10. 4. DM ty 2-on oral prandin 2 mg bid, metformin 500 bid at home which have been held.  CBG 4 times a day a.c. and now without meal coverage  5. HLd-continue Lipitor 20 daily 6. Gerd-monitor 7. Stage 1 ckd-d.c hygroton this admit-high risk for this deranged electrolytes this medication-PCP to restart if needed.  If for some reason also on sodium bicarbonate which we have not discontinued 8. Hypokalemia-replace orally kdur 40 daily  Code Status: full Family Communication:  Discussed with the family present in room Disposition Plan: inpatient pending resolution-probable skilled care in one to 2 days   Isabella KochJai Lucky Alverson, MD  [facility] Pager [phone] 08/06/2013, 2:07 PM    LOS: 2 days

## 2013-08-07 ENCOUNTER — Encounter (HOSPITAL_COMMUNITY): Payer: Self-pay | Admitting: Orthopaedic Surgery

## 2013-08-07 LAB — CBC WITH DIFFERENTIAL/PLATELET
BASOS ABS: 0.1 10*3/uL (ref 0.0–0.1)
Basophils Relative: 1 % (ref 0–1)
EOS ABS: 0.3 10*3/uL (ref 0.0–0.7)
EOS PCT: 4 % (ref 0–5)
HCT: 30.5 % — ABNORMAL LOW (ref 36.0–46.0)
Hemoglobin: 10.2 g/dL — ABNORMAL LOW (ref 12.0–15.0)
LYMPHS PCT: 15 % (ref 12–46)
Lymphs Abs: 1.4 10*3/uL (ref 0.7–4.0)
MCH: 28.3 pg (ref 26.0–34.0)
MCHC: 33.4 g/dL (ref 30.0–36.0)
MCV: 84.7 fL (ref 78.0–100.0)
Monocytes Absolute: 0.8 10*3/uL (ref 0.1–1.0)
Monocytes Relative: 9 % (ref 3–12)
Neutro Abs: 6.7 10*3/uL (ref 1.7–7.7)
Neutrophils Relative %: 71 % (ref 43–77)
PLATELETS: 192 10*3/uL (ref 150–400)
RBC: 3.6 MIL/uL — ABNORMAL LOW (ref 3.87–5.11)
RDW: 14.2 % (ref 11.5–15.5)
WBC: 9.3 10*3/uL (ref 4.0–10.5)

## 2013-08-07 LAB — BASIC METABOLIC PANEL
ANION GAP: 13 (ref 5–15)
BUN: 23 mg/dL (ref 6–23)
CALCIUM: 9.4 mg/dL (ref 8.4–10.5)
CO2: 27 mEq/L (ref 19–32)
Chloride: 98 mEq/L (ref 96–112)
Creatinine, Ser: 1 mg/dL (ref 0.50–1.10)
GFR calc Af Amer: 62 mL/min — ABNORMAL LOW (ref >90)
GFR calc non Af Amer: 54 mL/min — ABNORMAL LOW (ref >90)
Glucose, Bld: 176 mg/dL — ABNORMAL HIGH (ref 70–99)
POTASSIUM: 3.4 meq/L — AB (ref 3.7–5.3)
Sodium: 138 mEq/L (ref 137–147)

## 2013-08-07 LAB — GLUCOSE, CAPILLARY
GLUCOSE-CAPILLARY: 170 mg/dL — AB (ref 70–99)
Glucose-Capillary: 137 mg/dL — ABNORMAL HIGH (ref 70–99)
Glucose-Capillary: 194 mg/dL — ABNORMAL HIGH (ref 70–99)
Glucose-Capillary: 164 mg/dL — ABNORMAL HIGH (ref 70–99)

## 2013-08-07 LAB — MAGNESIUM: Magnesium: 1.7 mg/dL (ref 1.5–2.5)

## 2013-08-07 MED ORDER — NAPROXEN 250 MG PO TABS
500.0000 mg | ORAL_TABLET | Freq: Two times a day (BID) | ORAL | Status: DC
  Administered 2013-08-07 – 2013-08-08 (×2): 500 mg via ORAL
  Filled 2013-08-07 (×2): qty 2

## 2013-08-07 MED ORDER — OXYCODONE-ACETAMINOPHEN 5-325 MG PO TABS
1.0000 | ORAL_TABLET | ORAL | Status: DC
  Administered 2013-08-07 – 2013-08-08 (×7): 1 via ORAL
  Filled 2013-08-07 (×7): qty 1

## 2013-08-07 MED ORDER — FERROUS SULFATE 325 (65 FE) MG PO TABS
325.0000 mg | ORAL_TABLET | Freq: Every day | ORAL | Status: DC
  Administered 2013-08-08: 325 mg via ORAL
  Filled 2013-08-07: qty 1

## 2013-08-07 MED ORDER — SENNOSIDES-DOCUSATE SODIUM 8.6-50 MG PO TABS
1.0000 | ORAL_TABLET | Freq: Two times a day (BID) | ORAL | Status: DC
  Administered 2013-08-07 – 2013-08-08 (×3): 1 via ORAL
  Filled 2013-08-07 (×3): qty 1

## 2013-08-07 NOTE — Progress Notes (Signed)
Physical Therapy Treatment Patient Details Name: Isabella Morris MRN: [id number] DOB: 1935/03/23 Today's Date: 08/07/2013    History of Present Illness This is a 78 year old reasonably healthy lady who has hypertension and diabetes who presents with an accidental fall that occurred this morning approximately 10 AM. She slipped on wet concrete floor. She did not have any head injury. There was no loss of consciousness. She went to the emergency room and was found to have a left hip fracture. Unfortunately, we didn't have any medical records. She was transferred to this hospital for further management. She does not give a history of chest pain on exertion or at rest. She has no history of dyspnea. She's never had a myocardial infarction or stroke.    PT Comments    Pt reports decreased pain prior to treatment today, between 1-2/10 in Lt hip.  Pt able to complete supine exercises, with AAROM on the Lt LE and AROM on the Rt LE without increases in pain.  Pt reported that she would like to sit up in chair for a while at end of treatment session.  Re-educated pt on WB precautions for TTWB.  Noted improved bed mobility skills, with decreased assistance and decreased time to complete task.  Pt able to complete transfer without stopping during transfer; pt did require seated rest break at EOB secondary to dizziness after movement.  Noted improved ability to stand today, while maintaining WB precautions, as pt was able to stand fully upright with mod/max assist to maintain.  Pt able to stand pivot transfer with max assist for assisted W/S to clear Lt foot and to move RW during transfer (NSG aide nearby for supervision for safety/assist if needed).  Pt seated in chair, with complaints of dizziness; educated pt on deep breathing techniques while sitting in chair to decrease symptoms.  Recommendations for continued PT while in hospital and transition to SNF still appropriate.     Follow Up Recommendations  SNF           Precautions / Restrictions Restrictions Weight Bearing Restrictions: Yes LLE Weight Bearing: Touchdown weight bearing Other Position/Activity Restrictions: TTWB    Mobility  Bed Mobility Overal bed mobility: Needs Assistance Bed Mobility: Supine to Sit     Supine to sit: Min assist;Mod assist;HOB elevated (Min assist LE, mod assist trunk)     General bed mobility comments: Noted increased speed with transfer today without stop in the middle of movement; pt did required seated rest break at EOB secondary to dizziness  Transfers Overall transfer level: Needs assistance Equipment used: Rolling walker (2 wheeled) Transfers: Sit to/from BJ'sStand;Stand Pivot Transfers Sit to Stand: From elevated surface;Max assist Stand pivot transfers: Mod assist;Max assist          Ambulation/Gait             General Gait Details: Unable secondary to pain once in standing.        Balance Overall balance assessment: Needs assistance Sitting-balance support: Feet supported;Bilateral upper extremity supported Sitting balance-Leahy Scale: Good     Standing balance support: Bilateral upper extremity supported;During functional activity Standing balance-Leahy Scale: Fair                         Exercises General Exercises - Lower Extremity Ankle Circles/Pumps: 20 reps;AROM;Both;Supine Heel Slides: 20 reps;AROM;AAROM;Both;Supine (AROM Rt LE, AAROM Lt LE) Hip ABduction/ADduction: 20 reps;AROM;AAROM;Both;Supine (AROM Rt LE, AAROM Lt LE) Straight Leg Raises: 20 reps;Strengthening;Right;Supine  Pertinent Vitals/Pain Pain 1-2/10 in Lt hip.            PT Goals (current goals can now be found in the care plan section) Progress towards PT goals: Progressing toward goals    Frequency  7X/week    PT Plan Current plan remains appropriate       End of Session Equipment Utilized During Treatment: Gait belt Activity Tolerance: Patient limited by pain;Patient  limited by fatigue Patient left: in chair;with call bell/phone within reach;with chair alarm set;with family/visitor present     Time: 8295-6213 PT Time Calculation (min): 34 min  Charges:  $Therapeutic Exercise: 8-22 mins $Therapeutic Activity: 8-22 mins                    Chelly Dombeck 08/07/2013, 10:03 AM

## 2013-08-07 NOTE — Clinical Social Work Note (Signed)
[facility], family has toured facility and signed preadmission paperwork.  Santa GeneraAnne Cunningham, LCSW Clinical Social Worker [phone])

## 2013-08-07 NOTE — Progress Notes (Addendum)
[facility] PROGRESS NOTE  Isabella Morris:[id number] DOB: 1935/02/05 DOA: 08/04/2013 PCP: Toma Deiters, MD  78 y/o ?, history hypertension, diabetes mellitus ty 2 on orals, Hld status post accidental fall 7/27 resulting in left hip fracture Patient underwent repair ORIF 08/05/13.      Assessment/Plan: L intertrochanteric hip #-per ortho-have discontinued PCA. Pain controlled. continue aspirin 325 mg for secondary prophylaxis of DVT. Weightbearing precautions as per orthopedics. Likely discharge to snf tomorrow or Saturday per ortho   Anemia of acute loss from Surgery [expected]-  S/p surgery. Stable at 10. Small amount blood on left hip dressing. Cbc am Wound appears clean on exam   Htn-mod control on Atenolol 50, Amlodipine 10.   DM ty 2-on oral prandin 2 mg bid, metformin 500 bid at home which have been held. CBG range 137-194. Continue SSI   HLd-continue Lipitor 20 daily   Gerd- stable monitor   Stage 1 ckd- stable. d.c hygroton this admit-high risk for this deranged electrolytes this medication-PCP to restart if needed.   History metabolic acidosis-Was placed on sodium bicarbonate by Dr. Olena Leatherwood.  Given her bicarbonate today was 27, I have discontinued this   Hypokalemia- mild. trending up only slightly. Will check mag level. replace orally kdur 40 daily  Constipation: likely related to narcotics  Code Status: full Family Communication: daughter at bedside Disposition Plan: snf hopefully tomorrow   Consultants:  Dr Hilda Lias orthopedics  Procedures: OPEN REDUCTION INTERNAL FIXATION HIP (Left) 08/05/13   Antibiotics:  none  HPI/Subjective: Awake alert. Reports pain well controlled. Reports no BM since before admission Was a little dizzy  standing this morning with therapy -this is the first that she has stood since admission resolved by itself     Objective: Filed Vitals:   08/07/13 0557  BP: 125/53  Pulse: 70  Temp: 98.8 F (37.1 C)  Resp: 19     Intake/Output Summary (Last 24 hours) at 08/07/13 0950 Last data filed at 08/07/13 0900  Gross per 24 hour  Intake 2172.66 ml  Output   1435 ml  Net 737.66 ml   Filed Weights   08/04/13 1627 08/04/13 1650 08/05/13 0700  Weight: 78.472 kg (173 lb) 78.472 kg (173 lb) 79.8 kg (175 lb 14.8 oz)    Exam:   General:  Well nourished appears comfortable  Cardiovascular: RRR No MGR no LE edema  Respiratory: normal effort BS clear bilaterally no wheeze  Abdomen: soft non-distended +BS non-tender to palpation  Musculoskeletal: dressing left hip with small amount blood. Left foot warm and dry. PPP   Skin-wound seems clean no acute erythema staples are intact  Data Reviewed: Basic Metabolic Panel:  Recent Labs Lab 08/04/13 1725 08/05/13 0553 08/06/13 0533 08/07/13 0526  NA 140 140 141 138  K 3.8 3.3* 3.3* 3.4*  CL 98 98 99 98  CO2 26 28 26 27   GLUCOSE 197* 164* 173* 176*  BUN 25* 23 25* 23  CREATININE 1.05 1.08 1.04 1.00  CALCIUM 9.8 9.2 8.6 9.4   Liver Function Tests:  Recent Labs Lab 08/05/13 0553  AST 39*  ALT 23  ALKPHOS 48  BILITOT 0.9  PROT 6.7  ALBUMIN 3.5   No results found for this basename: LIPASE, AMYLASE,  in the last 168 hours No results found for this basename: AMMONIA,  in the last 168 hours CBC:  Recent Labs Lab 08/04/13 1725 08/05/13 0553 08/06/13 0533 08/07/13 0526  WBC 10.4 7.9 8.9 9.3  NEUTROABS  --  5.8 6.9 6.7  HGB 12.7 12.3 10.9* 10.2*  HCT 37.6 36.7 32.9* 30.5*  MCV 83.7 84.4 84.6 84.7  PLT 266 241 200 192   Cardiac Enzymes: No results found for this basename: CKTOTAL, CKMB, CKMBINDEX, TROPONINI,  in the last 168 hours BNP (last 3 results) No results found for this basename: PROBNP,  in the last 8760 hours CBG:  Recent Labs Lab 08/06/13 0720 08/06/13 1120 08/06/13 1632 08/06/13 2152 08/07/13 0721  GLUCAP 154* 174* 180* 153* 137*    Recent Results (from the past 240 hour(s))  SURGICAL PCR SCREEN     Status: None    Collection Time    08/04/13  6:15 PM      Result Value Ref Range Status   MRSA, PCR NEGATIVE  NEGATIVE Final   Staphylococcus aureus NEGATIVE  NEGATIVE Final   Comment:            The Xpert SA Assay (FDA     approved for NASAL specimens     in patients over 76 years of age),     is one component of     a comprehensive surveillance     program.  Test performance has     been validated by [facility] for patients greater     than or equal to 76 year old.     It is not intended     to diagnose infection nor to     guide or monitor treatment.     Studies: Dg Hip Operative Left  08/05/2013   CLINICAL DATA:  Left hip operative reduction and internal fixation.  EXAM: OPERATIVE LEFT HIP two views  COMPARISON:  08/04/2013  FINDINGS: Open reduction and internal fixation of the left hip noted with hip screw well positioned, could alignment at the intertrochanteric fracture site, and without complicating feature radiographically apparent.  IMPRESSION: 1. Left hip ORIF with excellent alignment and without radiographic findings of complicating feature.   Electronically Signed   By: Herbie BaltimoreWalt  Liebkemann M.D.   On: 08/05/2013 17:41   Dg Chest Port 1 View  08/05/2013   CLINICAL DATA:  Preoperative radiograph.  LEFT hip fracture.  EXAM: PORTABLE CHEST - 1 VIEW  COMPARISON:  None.  FINDINGS: Cardiopericardial silhouette upper limits of normal for projection. Lung volumes are mildly decreased. Tortuous thoracic aorta. Patient mildly rotated to the RIGHT. Aortic arch atherosclerosis.  No airspace disease. No effusion. Bilateral AC joint osteoarthritis. No acute cardiopulmonary disease.  IMPRESSION: No acute cardiopulmonary disease.  Borderline heart size.   Electronically Signed   By: Andreas NewportGeoffrey  Lamke M.D.   On: 08/05/2013 11:24    Scheduled Meds: . amLODipine  10 mg Oral Daily  . atenolol  50 mg Oral Daily  . atorvastatin  20 mg Oral q1800  . calcium carbonate  1,250 mg Oral BID WC  . calcium-vitamin  D  1 tablet Oral BID WC  . cholecalciferol  400 Units Oral Daily  . enoxaparin (LOVENOX) injection  40 mg Subcutaneous Q24H  . furosemide  20 mg Oral Daily  . insulin aspart  0-9 Units Subcutaneous TID WC  . oxyCODONE-acetaminophen  1 tablet Oral Q4H  . potassium chloride  40 mEq Oral Daily  . senna-docusate  1 tablet Oral BID  . sodium bicarbonate  650 mg Oral BID   Continuous Infusions: . sodium chloride 50 mL/hr at 08/07/13 [id number]    Active Problems:   Intertrochanteric fracture of left hip   HTN (hypertension)   Diabetes  Hip fracture    Time spent: 35 minutes    [facility]  [facility] Pager [phone]. If 7PM-7AM, please contact night-coverage at [email or web address], password [facility] 08/07/2013, 9:50 AM  LOS: 3 days       I have seen this patient independently, discussed POC with MID-level provider and patient and ammended note and plan  where needed  Pleas Koch, MD [facility] [phone]

## 2013-08-07 NOTE — Progress Notes (Signed)
Subjective: 2 Days Post-Op Procedure(s) (LRB): OPEN REDUCTION INTERNAL FIXATION HIP (Left) Patient reports pain as 3 on 0-10 scale.    Objective: Vital signs in last 24 hours: Temp:  [98.2 F (36.8 C)-99.8 F (37.7 C)] 98.8 F (37.1 C) (07/30 0557) Pulse Rate:  [68-79] 70 (07/30 0557) Resp:  [16-22] 19 (07/30 0557) BP: (123-131)/(52-56) 125/53 mmHg (07/30 0557) SpO2:  [88 %-96 %] 92 % (07/30 0557)  Intake/Output from previous day: 07/29 0701 - 07/30 0700 In: 2092.7 [P.O.:900; I.V.:1192.7] Out: 1435 [Urine:1375; Drains:60] Intake/Output this shift:     Recent Labs  08/04/13 1725 08/05/13 0553 08/06/13 0533 08/07/13 0526  HGB 12.7 12.3 10.9* 10.2*    Recent Labs  08/06/13 0533 08/07/13 0526  WBC 8.9 9.3  RBC 3.89 3.60*  HCT 32.9* 30.5*  PLT 200 192    Recent Labs  08/06/13 0533 08/07/13 0526  NA 141 138  K 3.3* 3.4*  CL 99 98  CO2 26 27  BUN 25* 23  CREATININE 1.04 1.00  GLUCOSE 173* 176*  CALCIUM 8.6 9.4   No results found for this basename: LABPT, INR,  in the last 72 hours  Neurologically intact Neurovascular intact Sensation intact distally Intact pulses distally Dorsiflexion/Plantar flexion intact Incision: scant drainage  Hemovac and foley removed today.  Wound looks good.  Continue physical therapy.  Assessment/Plan: 2 Days Post-Op Procedure(s) (LRB): OPEN REDUCTION INTERNAL FIXATION HIP (Left) Up with therapy  She should be ready for discharge to SNF tomorrow or Saturday.  She will need to continue enoxaparin for one month at SNF.  She will need PT and toe touch weight bearing on the left hip with walker.  Sutures of the left hip need to be removed on August 7th or 8th.  Steri-strip wound.  I need to see her in my office in one month after discharge. She will need x-rays of the left hip prior to the appointment.  Bring x-rays to the office.  [phone].   Isabella Morris 08/07/2013, 7:09 AM

## 2013-08-08 DIAGNOSIS — I10 Essential (primary) hypertension: Secondary | ICD-10-CM | POA: Diagnosis not present

## 2013-08-08 DIAGNOSIS — I129 Hypertensive chronic kidney disease with stage 1 through stage 4 chronic kidney disease, or unspecified chronic kidney disease: Secondary | ICD-10-CM | POA: Diagnosis not present

## 2013-08-08 DIAGNOSIS — IMO0002 Reserved for concepts with insufficient information to code with codable children: Secondary | ICD-10-CM | POA: Diagnosis not present

## 2013-08-08 DIAGNOSIS — R293 Abnormal posture: Secondary | ICD-10-CM | POA: Diagnosis not present

## 2013-08-08 DIAGNOSIS — S72143A Displaced intertrochanteric fracture of unspecified femur, initial encounter for closed fracture: Secondary | ICD-10-CM | POA: Diagnosis not present

## 2013-08-08 DIAGNOSIS — D649 Anemia, unspecified: Secondary | ICD-10-CM | POA: Diagnosis not present

## 2013-08-08 DIAGNOSIS — E119 Type 2 diabetes mellitus without complications: Secondary | ICD-10-CM | POA: Diagnosis not present

## 2013-08-08 DIAGNOSIS — Z7401 Bed confinement status: Secondary | ICD-10-CM | POA: Diagnosis not present

## 2013-08-08 DIAGNOSIS — Z9181 History of falling: Secondary | ICD-10-CM | POA: Diagnosis not present

## 2013-08-08 DIAGNOSIS — K219 Gastro-esophageal reflux disease without esophagitis: Secondary | ICD-10-CM | POA: Diagnosis not present

## 2013-08-08 DIAGNOSIS — E139 Other specified diabetes mellitus without complications: Secondary | ICD-10-CM | POA: Diagnosis not present

## 2013-08-08 DIAGNOSIS — IMO0001 Reserved for inherently not codable concepts without codable children: Secondary | ICD-10-CM | POA: Diagnosis not present

## 2013-08-08 DIAGNOSIS — M2550 Pain in unspecified joint: Secondary | ICD-10-CM | POA: Diagnosis not present

## 2013-08-08 DIAGNOSIS — Z5189 Encounter for other specified aftercare: Secondary | ICD-10-CM | POA: Diagnosis not present

## 2013-08-08 DIAGNOSIS — R279 Unspecified lack of coordination: Secondary | ICD-10-CM | POA: Diagnosis not present

## 2013-08-08 DIAGNOSIS — N189 Chronic kidney disease, unspecified: Secondary | ICD-10-CM | POA: Diagnosis not present

## 2013-08-08 DIAGNOSIS — E785 Hyperlipidemia, unspecified: Secondary | ICD-10-CM | POA: Diagnosis not present

## 2013-08-08 DIAGNOSIS — R269 Unspecified abnormalities of gait and mobility: Secondary | ICD-10-CM | POA: Diagnosis not present

## 2013-08-08 DIAGNOSIS — N19 Unspecified kidney failure: Secondary | ICD-10-CM | POA: Diagnosis not present

## 2013-08-08 DIAGNOSIS — K469 Unspecified abdominal hernia without obstruction or gangrene: Secondary | ICD-10-CM | POA: Diagnosis not present

## 2013-08-08 DIAGNOSIS — N958 Other specified menopausal and perimenopausal disorders: Secondary | ICD-10-CM | POA: Diagnosis not present

## 2013-08-08 DIAGNOSIS — M6281 Muscle weakness (generalized): Secondary | ICD-10-CM | POA: Diagnosis not present

## 2013-08-08 DIAGNOSIS — M25559 Pain in unspecified hip: Secondary | ICD-10-CM | POA: Diagnosis not present

## 2013-08-08 DIAGNOSIS — Z96649 Presence of unspecified artificial hip joint: Secondary | ICD-10-CM | POA: Diagnosis not present

## 2013-08-08 LAB — BASIC METABOLIC PANEL
Anion gap: 14 (ref 5–15)
BUN: 23 mg/dL (ref 6–23)
CHLORIDE: 98 meq/L (ref 96–112)
CO2: 26 meq/L (ref 19–32)
Calcium: 9.6 mg/dL (ref 8.4–10.5)
Creatinine, Ser: 0.95 mg/dL (ref 0.50–1.10)
GFR calc Af Amer: 66 mL/min — ABNORMAL LOW (ref >90)
GFR calc non Af Amer: 57 mL/min — ABNORMAL LOW (ref >90)
Glucose, Bld: 192 mg/dL — ABNORMAL HIGH (ref 70–99)
Potassium: 3.7 mEq/L (ref 3.7–5.3)
SODIUM: 138 meq/L (ref 137–147)

## 2013-08-08 LAB — CBC WITH DIFFERENTIAL/PLATELET
Basophils Absolute: 0.1 10*3/uL (ref 0.0–0.1)
Basophils Relative: 1 % (ref 0–1)
EOS PCT: 7 % — AB (ref 0–5)
Eosinophils Absolute: 0.6 10*3/uL (ref 0.0–0.7)
HCT: 31.6 % — ABNORMAL LOW (ref 36.0–46.0)
HEMOGLOBIN: 10.6 g/dL — AB (ref 12.0–15.0)
LYMPHS ABS: 1.3 10*3/uL (ref 0.7–4.0)
LYMPHS PCT: 16 % (ref 12–46)
MCH: 28.4 pg (ref 26.0–34.0)
MCHC: 33.5 g/dL (ref 30.0–36.0)
MCV: 84.7 fL (ref 78.0–100.0)
Monocytes Absolute: 0.9 10*3/uL (ref 0.1–1.0)
Monocytes Relative: 10 % (ref 3–12)
Neutro Abs: 5.6 10*3/uL (ref 1.7–7.7)
Neutrophils Relative %: 66 % (ref 43–77)
Platelets: 252 10*3/uL (ref 150–400)
RBC: 3.73 MIL/uL — AB (ref 3.87–5.11)
RDW: 14.2 % (ref 11.5–15.5)
WBC: 8.4 10*3/uL (ref 4.0–10.5)

## 2013-08-08 LAB — TYPE AND SCREEN
ABO/RH(D): AB POS
Antibody Screen: NEGATIVE
UNIT DIVISION: 0
UNIT DIVISION: 0
Unit division: 0

## 2013-08-08 LAB — GLUCOSE, CAPILLARY
Glucose-Capillary: 188 mg/dL — ABNORMAL HIGH (ref 70–99)
Glucose-Capillary: 239 mg/dL — ABNORMAL HIGH (ref 70–99)

## 2013-08-08 MED ORDER — POTASSIUM CHLORIDE CRYS ER 20 MEQ PO TBCR: 40.0000 meq | EXTENDED_RELEASE_TABLET | Freq: Every day | ORAL | Status: DC

## 2013-08-08 MED ORDER — SENNOSIDES-DOCUSATE SODIUM 8.6-50 MG PO TABS: 1.0000 | ORAL_TABLET | Freq: Two times a day (BID) | ORAL | Status: AC

## 2013-08-08 MED ORDER — FERROUS SULFATE 325 (65 FE) MG PO TABS: 325.0000 mg | ORAL_TABLET | Freq: Every day | ORAL | Status: DC

## 2013-08-08 MED ORDER — ENOXAPARIN SODIUM 40 MG/0.4ML ~~LOC~~ SOLN: 40.0000 mg | SUBCUTANEOUS | Status: DC

## 2013-08-08 MED ORDER — OXYCODONE-ACETAMINOPHEN 5-325 MG PO TABS: 1.0000 | ORAL_TABLET | ORAL | Status: DC

## 2013-08-08 NOTE — Progress Notes (Signed)
Late entry for 1320: pt being d/c at this time [facility] for further rehab. IV was d/c without complications. Pt and family have no questions at this time. Report was given to [facility] Vitals stable at time of d/c. encourgaed pts family to check room thoroughly for belongings at time of d/c.  Sheryn BisonGordon, Kimsey Demaree Warner

## 2013-08-08 NOTE — Discharge Summary (Signed)
Physician Discharge Summary  Isabella Morris:[id number] DOB: [birth year] DOA: 08/04/2013  PCP: Toma Deiters, MD  Admit date: 08/04/2013 Discharge date: 08/08/2013  Time spent: 40 minutes  Recommendations for Outpatient Follow-up:  1. Follow up with dr Hilda Lias in 1 month with xrays.   2. Discharge to [facility].   Discharge Diagnoses:  Active Problems:   Intertrochanteric fracture of left hip   HTN (hypertension)   Diabetes   Hip fracture   Discharge Condition: stable  Diet recommendation: diet carb  Filed Weights   08/04/13 1627 08/04/13 1650 08/05/13 0700  Weight: 78.472 kg (173 lb) 78.472 kg (173 lb) 79.8 kg (175 lb 14.8 oz)    History of present illness:  This is a 78 year old reasonably healthy lady who has hypertension and diabetes who presentrf on 08/04/13 with an accidental fall. She slipped on wet concrete floor. She did not have any head injury. There was no loss of consciousness. She went to the emergency room and was found to have a left hip fracture. She did not give a history of chest pain on exertion or at rest. She has no history of dyspnea. She'd never had a myocardial infarction or stroke  Hospital Course:  L intertrochanteric hip fx. S/p ORIF.  Pain controlled. Touch down weightbearing with walker. Continue lovenox 30 days per ortho. Follow up 1 month.    Anemia of acute loss from Surgery [expected]- S/p surgery. Stable at 10.    Htn-mod control on Atenolol 50, Amlodipine 10.   DM ty 2- controlled continue home regimen   HLd-continue Lipitor 20 daily   Gerd- stable   Stage 1 ckd- stable.    History metabolic acidosis-Was placed on sodium bicarbonate by Dr. Olena Leatherwood.    Hypokalemia- resolved   Constipation: likely related to narcotics   Procedures:  Left ORIF 08/05/13  Consultations:  [facility]  Discharge Exam: Filed Vitals:   08/08/13 0837  BP: 140/72  Pulse: 73  Temp: 99 F (37.2 C)  Resp: 18    General:  awake alert appears comfortable Cardiovascular: RRR No MGR No LE edema Respiratory: normal effort BS clear bilaterally no wheeze  Discharge Instructions You were cared for by a hospitalist during your hospital stay. If you have any questions about your discharge medications or the care you received while you were in the hospital after you are discharged, you can call the unit and asked to speak with the hospitalist on call if the hospitalist that took care of you is not available. Once you are discharged, your primary care physician will handle any further medical issues. Please note that NO REFILLS for any discharge medications will be authorized once you are discharged, as it is imperative that you return to your primary care physician (or establish a relationship with a primary care physician if you do not have one) for your aftercare needs so that they can reassess your need for medications and monitor your lab values.     Medication List         amLODipine 10 MG tablet  Commonly known as:  NORVASC  Take 10 mg by mouth daily.     atenolol-chlorthalidone 50-25 MG per tablet  Commonly known as:  TENORETIC  Take 1 tablet by mouth daily.     atorvastatin 20 MG tablet  Commonly known as:  LIPITOR  Take 20 mg by mouth daily.     calcium carbonate 600 MG Tabs tablet  Commonly known as:  OS-CAL  Take 600  mg by mouth 2 (two) times daily with a meal.     CALTRATE 600+D 600-800 MG-UNIT Tabs  Generic drug:  Calcium Carb-Cholecalciferol  Take 1 tablet by mouth 2 (two) times daily with breakfast and lunch.     cholecalciferol 400 UNITS Tabs tablet  Commonly known as:  VITAMIN D  Take 400 Units by mouth.     Chromium-Cinnamon 50-500 MCG-MG Caps  Take 2 capsules by mouth every morning.     enoxaparin 40 MG/0.4ML injection  Commonly known as:  LOVENOX  Inject 0.4 mLs (40 mg total) into the skin daily.     ferrous sulfate 325 (65 FE) MG tablet  Take 1 tablet (325 mg total) by mouth  daily with breakfast.     furosemide 20 MG tablet  Commonly known as:  LASIX  Take 20 mg by mouth daily.     metFORMIN 500 MG tablet  Commonly known as:  GLUCOPHAGE  Take 500 mg by mouth 2 (two) times daily.     oxyCODONE-acetaminophen 5-325 MG per tablet  Commonly known as:  PERCOCET/ROXICET  Take 1 tablet by mouth every 4 (four) hours.     potassium chloride SA 20 MEQ tablet  Commonly known as:  K-DUR,KLOR-CON  Take 2 tablets (40 mEq total) by mouth daily.     ranitidine 150 MG tablet  Commonly known as:  ZANTAC  Take 150 mg by mouth 2 (two) times daily.     repaglinide 2 MG tablet  Commonly known as:  PRANDIN  Take 2 mg by mouth 2 (two) times daily.     senna-docusate 8.6-50 MG per tablet  Commonly known as:  Senokot-S  Take 1 tablet by mouth 2 (two) times daily.     sodium bicarbonate 650 MG tablet  Take 650 mg by mouth 2 (two) times daily.       Allergies  Allergen Reactions  . Codeine Nausea And Vomiting   Follow-up Information   Follow up with Darreld Mclean, MD On 08/08/2013. (office will contact facility to schedule appointment for 1 months with xrays)    Specialty:  Orthopedic Surgery   Contact information:   [address] [place] [address] [phone]        The results of significant diagnostics from this hospitalization (including imaging, microbiology, ancillary and laboratory) are listed below for reference.    Significant Diagnostic Studies: Dg Hip Operative Left  08/05/2013   CLINICAL DATA:  Left hip operative reduction and internal fixation.  EXAM: OPERATIVE LEFT HIP two views  COMPARISON:  08/04/2013  FINDINGS: Open reduction and internal fixation of the left hip noted with hip screw well positioned, could alignment at the intertrochanteric fracture site, and without complicating feature radiographically apparent.  IMPRESSION: 1. Left hip ORIF with excellent alignment and without radiographic findings of complicating feature.    Electronically Signed   By: Herbie Baltimore M.D.   On: 08/05/2013 17:41   Mr Hip Left Wo Contrast  08/05/2013   CLINICAL DATA:  Left hip pain after falling same date. No previous relevant surgery. Evaluate left hip fracture.  EXAM: MR OF THE LEFT HIP WITHOUT CONTRAST  TECHNIQUE: Multiplanar, multisequence MR imaging was performed. No intravenous contrast was administered.  COMPARISON:  Radiographs and CT same date.  FINDINGS: Bones: There is an acute intertrochanteric left femur fracture. Components extending through the greater trochanter are mildly displaced. There are nondisplaced components extending through the intertrochanteric region There is no evidence of femoral head avascular necrosis. The visualized bony  pelvis appears normal. The sacroiliac joints and symphysis pubis appear normal.  Articular cartilage and labrum  Articular cartilage: There are mild degenerative changes of both hips.  Labrum: There is no gross labral tear or paralabral abnormality.  Joint or bursal effusion  Joint effusion: No significant hip joint effusion.  Bursae: There is ill-defined edema/ hemorrhage within the musculature surrounding the left femoral intertrochanteric fracture. No focal bursal fluid collection identified.  Muscles and tendons  Muscles and tendons: Both common hamstring tendons demonstrate tendinosis without tearing. In addition, there is tendinosis of the gluteus medius tendons bilaterally  Other findings  Miscellaneous: Diverticular changes are present throughout the sigmoid colon.  IMPRESSION: Acute intertrochanteric left femur fracture as described with surrounding muscular edema/ hemorrhage. No dislocation or pelvic fracture demonstrated.   Electronically Signed   By: Roxy HorsemanBill  Veazey M.D.   On: 08/05/2013 07:53   Dg Chest Port 1 View  08/05/2013   CLINICAL DATA:  Preoperative radiograph.  LEFT hip fracture.  EXAM: PORTABLE CHEST - 1 VIEW  COMPARISON:  None.  FINDINGS: Cardiopericardial silhouette upper  limits of normal for projection. Lung volumes are mildly decreased. Tortuous thoracic aorta. Patient mildly rotated to the RIGHT. Aortic arch atherosclerosis.  No airspace disease. No effusion. Bilateral AC joint osteoarthritis. No acute cardiopulmonary disease.  IMPRESSION: No acute cardiopulmonary disease.  Borderline heart size.   Electronically Signed   By: Andreas NewportGeoffrey  Lamke M.D.   On: 08/05/2013 11:24    Microbiology: Recent Results (from the past 240 hour(s))  SURGICAL PCR SCREEN     Status: None   Collection Time    08/04/13  6:15 PM      Result Value Ref Range Status   MRSA, PCR NEGATIVE  NEGATIVE Final   Staphylococcus aureus NEGATIVE  NEGATIVE Final   Comment:            The Xpert SA Assay (FDA     approved for NASAL specimens     in patients over 421 years of age),     is one component of     a comprehensive surveillance     program.  Test performance has     been validated by [facility] for patients greater     than or equal to 76 year old.     It is not intended     to diagnose infection nor to     guide or monitor treatment.     Labs: Basic Metabolic Panel:  Recent Labs Lab 08/04/13 1725 08/05/13 0553 08/06/13 0533 08/07/13 0526 08/08/13 0544  NA 140 140 141 138 138  K 3.8 3.3* 3.3* 3.4* 3.7  CL 98 98 99 98 98  CO2 26 28 26 27 26   GLUCOSE 197* 164* 173* 176* 192*  BUN 25* 23 25* 23 23  CREATININE 1.05 1.08 1.04 1.00 0.95  CALCIUM 9.8 9.2 8.6 9.4 9.6  MG  --   --   --  1.7  --    Liver Function Tests:  Recent Labs Lab 08/05/13 0553  AST 39*  ALT 23  ALKPHOS 48  BILITOT 0.9  PROT 6.7  ALBUMIN 3.5   No results found for this basename: LIPASE, AMYLASE,  in the last 168 hours No results found for this basename: AMMONIA,  in the last 168 hours CBC:  Recent Labs Lab 08/04/13 1725 08/05/13 0553 08/06/13 0533 08/07/13 0526 08/08/13 0544  WBC 10.4 7.9 8.9 9.3 8.4  NEUTROABS  --  5.8 6.9  6.7 5.6  HGB 12.7 12.3 10.9* 10.2* 10.6*  HCT 37.6  36.7 32.9* 30.5* 31.6*  MCV 83.7 84.4 84.6 84.7 84.7  PLT 266 241 200 192 252   Cardiac Enzymes: No results found for this basename: CKTOTAL, CKMB, CKMBINDEX, TROPONINI,  in the last 168 hours BNP: BNP (last 3 results) No results found for this basename: PROBNP,  in the last 8760 hours CBG:  Recent Labs Lab 08/07/13 0721 08/07/13 1147 08/07/13 1629 08/07/13 2026 08/08/13 0729  GLUCAP 137* 194* 164* 170* 188*       Signed:  Stephani Janak M  [facility] 08/08/2013, 10:54 AM

## 2013-08-08 NOTE — Progress Notes (Signed)
Physical Therapy Treatment Patient Details Name: Isabella EaglesLevoynne F Morris MRN: [id number] DOB: Oct 26, 1935 Today's Date: 08/08/2013    History of Present Illness      PT Comments    Pt pleasant and eager to participate with therapy.  Min assistance with supine to sit, Mod assistance with sit to stand and stand pivot [facility].  Pt able to verbalize appropriate weight bearing restrictions with standing.  Utilized RW for [facility]estepping [facility].  Pt left in chair with chair alarm set, call bell within reach and family in room.  No reports of increased pain through session.  Follow Up Recommendations        Equipment Recommendations       Recommendations for Other Services       Precautions / Restrictions Restrictions Weight Bearing Restrictions: Yes LLE Weight Bearing: Touchdown weight bearing Other Position/Activity Restrictions: TTWB    Mobility  Bed Mobility Overal bed mobility: Needs Assistance Bed Mobility: Supine to Sit     Supine to sit: Min assist     General bed mobility comments: MIn assistance, cueing for handplacement to assist and safety.  Increased speed with transfer  Transfers Overall transfer level: Needs assistance Equipment used: Rolling walker (2 wheeled) Transfers: Sit to/from [employer];Stand Pivot Transfers Sit to Stand: Min assist Stand pivot transfers: Mod assist       General transfer comment: Pt able to verbalize appropriate weight bearing precautions for Lt LE.  Ambulation/Gait Ambulation/Gait assistance: Min assist Ambulation Distance (Feet): 3 Feet Assistive device: Rolling walker (2 wheeled) Gait Pattern/deviations: Step-to pattern   Gait velocity interpretation: Below normal speed for age/gender General Gait Details: Sidestepping from bedside toilet to chair with appropriate weight bearing and min assist with RW   Stairs            Wheelchair Mobility    Modified Rankin (Stroke Patients Only)       Balance                                     Cognition Arousal/Alertness: Awake/alert Behavior During Therapy: WFL for tasks assessed/performed Overall Cognitive Status: Within Functional Limits for tasks assessed                      Exercises      General Comments        Pertinent Vitals/Pain No reports of increased pain through session.    Home Living                      Prior Function            PT Goals (current goals can now be found in the care plan section) Progress towards PT goals: Progressing toward goals    Frequency       PT Plan Current plan remains appropriate    Co-evaluation             End of Session   Activity Tolerance: Patient tolerated treatment well Patient left: in chair;with call bell/phone within reach;with chair alarm set;with family/visitor present     Time: [id number] PT Time Calculation (min): 17 min  Charges:  $Therapeutic Activity: 8-22 mins                    G Codes:      Juel BurrowCockerham, Emree Locicero Jo 08/08/2013, 9:39 AM

## 2013-08-08 NOTE — Discharge Summary (Signed)
-   Agree with plan as above. - pt can go to SNF.

## 2013-08-08 NOTE — Clinical Social Work Note (Signed)
EMS called, to arrive when available.  Santa GeneraAnne Cunningham, LCSW Clinical Social Worker [phone])

## 2013-08-08 NOTE — Clinical Social Work Note (Signed)
Patient ready for discharge, [facility]  FL2 reviewed w RN and updated as needed, discharge summary faxed to facility, discharge packet prepared and placed w shadow chart for transport.  Patient, facility and family all agreeable to transfer today.  Spouse, Oletha Blendonald Cutrone, informed of transfer to SNF.  Rn asked to call report to SNF.  CSW signing off as no further SW needs identified.  Santa GeneraAnne Jaxan Michel, LCSW Clinical Social Worker [phone])

## 2013-08-08 NOTE — Progress Notes (Signed)
Subjective: 3 Days Post-Op Procedure(s) (LRB): OPEN REDUCTION INTERNAL FIXATION HIP (Left) Patient reports pain as 3 on 0-10 scale.    Objective: Vital signs in last 24 hours: Temp:  [98.1 F (36.7 C)-98.6 F (37 C)] 98.1 F (36.7 C) (07/30 2248) Pulse Rate:  [67-74] 67 (07/30 2248) Resp:  [18-20] 20 (07/30 2248) BP: (134-141)/(54-63) 141/54 mmHg (07/30 2248) SpO2:  [95 %] 95 % (07/30 1505)  Intake/Output from previous day: 07/30 0701 - 07/31 0700 In: 1287.5 [P.O.:440; I.V.:847.5] Out: 750 [Urine:750] Intake/Output this shift:     Recent Labs  08/06/13 0533 08/07/13 0526  HGB 10.9* 10.2*    Recent Labs  08/06/13 0533 08/07/13 0526  WBC 8.9 9.3  RBC 3.89 3.60*  HCT 32.9* 30.5*  PLT 200 192    Recent Labs  08/06/13 0533 08/07/13 0526  NA 141 138  K 3.3* 3.4*  CL 99 98  CO2 26 27  BUN 25* 23  CREATININE 1.04 1.00  GLUCOSE 173* 176*  CALCIUM 8.6 9.4   No results found for this basename: LABPT, INR,  in the last 72 hours  Neurologically intact Neurovascular intact Sensation intact distally Intact pulses distally Dorsiflexion/Plantar flexion intact Incision: no drainage  She has done well in PT.  She can go to SNF today assuming labs OK.  The labs are not back yet.  I have gone over precautions and told her what to expect.  I also talked to her husband and daughter.  Assessment/Plan: 3 Days Post-Op Procedure(s) (LRB): OPEN REDUCTION INTERNAL FIXATION HIP (Left) Discharge to SNF  Please follow suggestions I listed in my note yesterday for suture removal, enoxaparin, and appointment.  Isabella Morris 08/08/2013, 7:09 AM

## 2013-08-08 NOTE — Plan of Care (Signed)
Problem: Phase III Progression Outcomes Goal: Activity at appropriate level-compared to baseline (UP IN CHAIR FOR HEMODIALYSIS)  Outcome: Progressing Being d/[facility] to [facility] for further assistance with mobility. Sheryn BisonGordon, Nikki Rusnak Warner

## 2013-08-08 NOTE — Discharge Instructions (Signed)
Touchdown weight bearing with walker Remove sutures 08/15/13 steri-wound Dr Sanjuan Damekeeling's office will contact facility to schedule appointment for 1 month. Will need xray

## 2013-08-08 NOTE — Progress Notes (Signed)
UR chart review completed.  

## 2013-09-11 DIAGNOSIS — IMO0001 Reserved for inherently not codable concepts without codable children: Secondary | ICD-10-CM | POA: Diagnosis not present

## 2013-09-11 DIAGNOSIS — Z96649 Presence of unspecified artificial hip joint: Secondary | ICD-10-CM | POA: Diagnosis not present

## 2013-09-11 DIAGNOSIS — I10 Essential (primary) hypertension: Secondary | ICD-10-CM | POA: Diagnosis not present

## 2013-10-06 DIAGNOSIS — S72009D Fracture of unspecified part of neck of unspecified femur, subsequent encounter for closed fracture with routine healing: Secondary | ICD-10-CM | POA: Diagnosis not present

## 2013-10-06 DIAGNOSIS — I10 Essential (primary) hypertension: Secondary | ICD-10-CM | POA: Diagnosis not present

## 2013-12-12 DIAGNOSIS — E1165 Type 2 diabetes mellitus with hyperglycemia: Secondary | ICD-10-CM | POA: Diagnosis not present

## 2013-12-12 DIAGNOSIS — I10 Essential (primary) hypertension: Secondary | ICD-10-CM | POA: Diagnosis not present

## 2014-01-07 DIAGNOSIS — I10 Essential (primary) hypertension: Secondary | ICD-10-CM | POA: Diagnosis not present

## 2014-01-07 DIAGNOSIS — S72142D Displaced intertrochanteric fracture of left femur, subsequent encounter for closed fracture with routine healing: Secondary | ICD-10-CM | POA: Diagnosis not present

## 2014-03-16 DIAGNOSIS — E1165 Type 2 diabetes mellitus with hyperglycemia: Secondary | ICD-10-CM | POA: Diagnosis not present

## 2014-03-16 DIAGNOSIS — I10 Essential (primary) hypertension: Secondary | ICD-10-CM | POA: Diagnosis not present

## 2014-06-16 DIAGNOSIS — E1165 Type 2 diabetes mellitus with hyperglycemia: Secondary | ICD-10-CM | POA: Diagnosis not present

## 2014-06-16 DIAGNOSIS — Z0000 Encounter for general adult medical examination without abnormal findings: Secondary | ICD-10-CM | POA: Diagnosis not present

## 2014-06-16 DIAGNOSIS — Z1389 Encounter for screening for other disorder: Secondary | ICD-10-CM | POA: Diagnosis not present

## 2014-06-16 DIAGNOSIS — I10 Essential (primary) hypertension: Secondary | ICD-10-CM | POA: Diagnosis not present

## 2014-09-28 DIAGNOSIS — E1121 Type 2 diabetes mellitus with diabetic nephropathy: Secondary | ICD-10-CM | POA: Diagnosis not present

## 2014-09-28 DIAGNOSIS — N182 Chronic kidney disease, stage 2 (mild): Secondary | ICD-10-CM | POA: Diagnosis not present

## 2014-09-28 DIAGNOSIS — I10 Essential (primary) hypertension: Secondary | ICD-10-CM | POA: Diagnosis not present

## 2014-12-29 DIAGNOSIS — I10 Essential (primary) hypertension: Secondary | ICD-10-CM | POA: Diagnosis not present

## 2014-12-29 DIAGNOSIS — E1121 Type 2 diabetes mellitus with diabetic nephropathy: Secondary | ICD-10-CM | POA: Diagnosis not present

## 2015-03-30 DIAGNOSIS — E1121 Type 2 diabetes mellitus with diabetic nephropathy: Secondary | ICD-10-CM | POA: Diagnosis not present

## 2015-03-30 DIAGNOSIS — I10 Essential (primary) hypertension: Secondary | ICD-10-CM | POA: Diagnosis not present

## 2015-07-08 DIAGNOSIS — Z0000 Encounter for general adult medical examination without abnormal findings: Secondary | ICD-10-CM | POA: Diagnosis not present

## 2015-07-08 DIAGNOSIS — E1121 Type 2 diabetes mellitus with diabetic nephropathy: Secondary | ICD-10-CM | POA: Diagnosis not present

## 2015-07-08 DIAGNOSIS — Z1389 Encounter for screening for other disorder: Secondary | ICD-10-CM | POA: Diagnosis not present

## 2015-07-08 DIAGNOSIS — I10 Essential (primary) hypertension: Secondary | ICD-10-CM | POA: Diagnosis not present

## 2015-11-02 DIAGNOSIS — Z79899 Other long term (current) drug therapy: Secondary | ICD-10-CM | POA: Diagnosis not present

## 2015-11-02 DIAGNOSIS — I10 Essential (primary) hypertension: Secondary | ICD-10-CM | POA: Diagnosis not present

## 2015-11-02 DIAGNOSIS — R5383 Other fatigue: Secondary | ICD-10-CM | POA: Diagnosis not present

## 2015-11-02 DIAGNOSIS — E1121 Type 2 diabetes mellitus with diabetic nephropathy: Secondary | ICD-10-CM | POA: Diagnosis not present

## 2015-11-02 DIAGNOSIS — K588 Other irritable bowel syndrome: Secondary | ICD-10-CM | POA: Diagnosis not present

## 2015-11-02 DIAGNOSIS — G3184 Mild cognitive impairment, so stated: Secondary | ICD-10-CM | POA: Diagnosis not present

## 2015-11-10 DIAGNOSIS — I10 Essential (primary) hypertension: Secondary | ICD-10-CM | POA: Diagnosis not present

## 2016-01-28 IMAGING — RF DG HIP OPERATIVE*L*
1 series · 3 of 3 positions shown · non-contrast
Comparison: 08/04/2013

CLINICAL DATA: Left hip operative reduction and internal fixation.

EXAM:
OPERATIVE LEFT HIP two views

[Series 1: run · 3 of 3 slices shown]
[im 1/3]
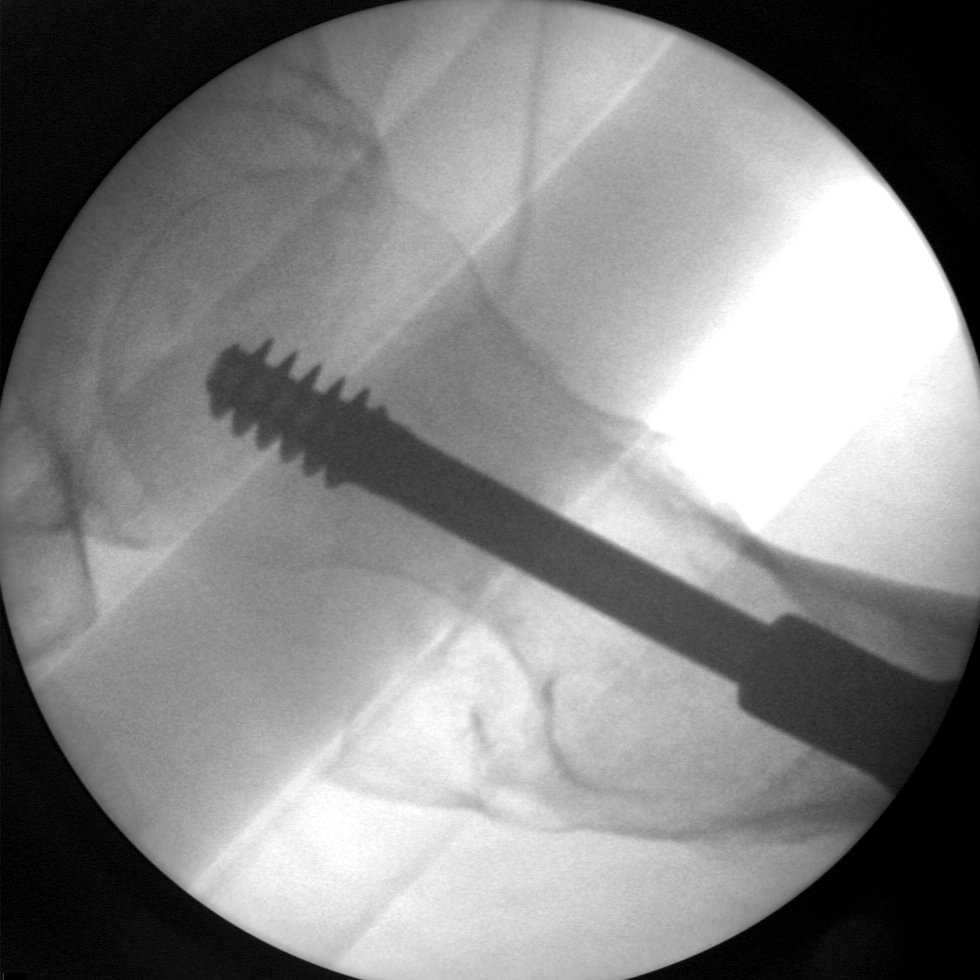
[im 2/3]
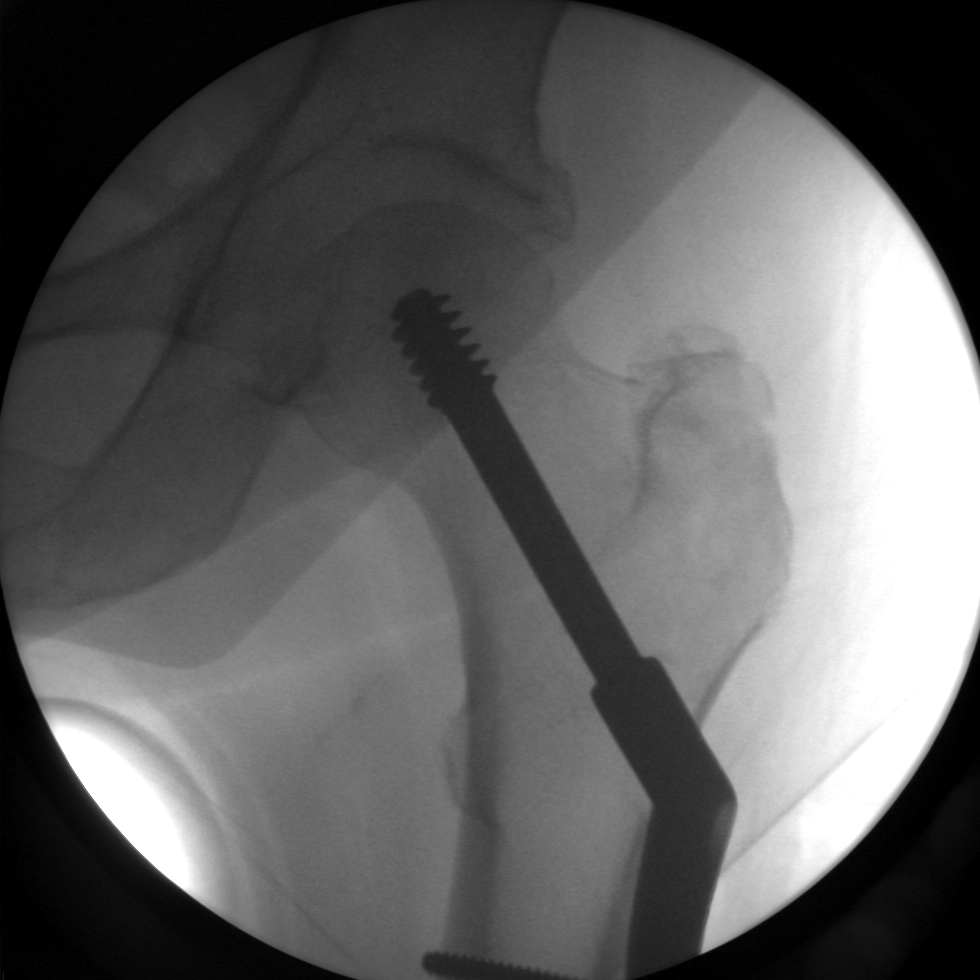
[im 3/3]
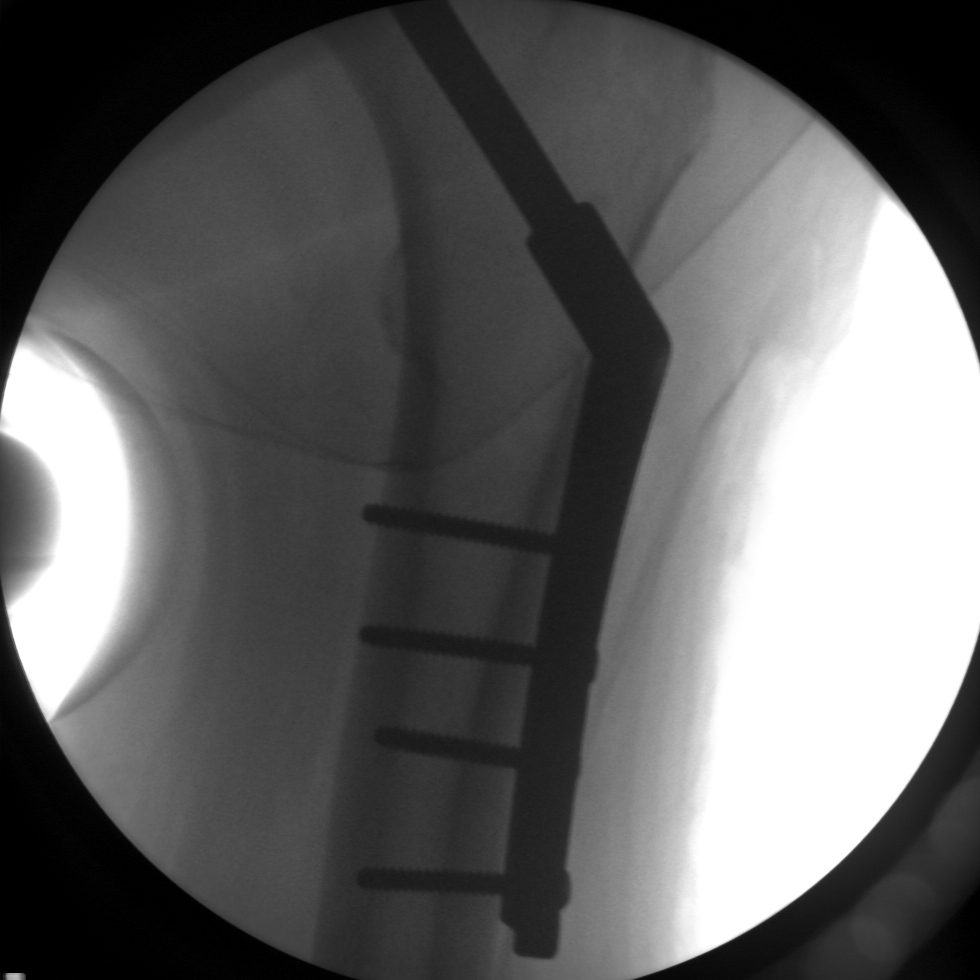

[3 of 3 positions shown; findings below may reference images not displayed]

FINDINGS: Open reduction and internal fixation of the left hip noted with hip
screw well positioned, could alignment at the intertrochanteric
fracture site, and without complicating feature radiographically
apparent.
IMPRESSION: 1. Left hip ORIF with excellent alignment and without radiographic
findings of complicating feature.

## 2016-02-28 DIAGNOSIS — G3184 Mild cognitive impairment, so stated: Secondary | ICD-10-CM | POA: Diagnosis not present

## 2016-02-28 DIAGNOSIS — I10 Essential (primary) hypertension: Secondary | ICD-10-CM | POA: Diagnosis not present

## 2016-02-28 DIAGNOSIS — E1121 Type 2 diabetes mellitus with diabetic nephropathy: Secondary | ICD-10-CM | POA: Diagnosis not present

## 2016-02-28 DIAGNOSIS — R5383 Other fatigue: Secondary | ICD-10-CM | POA: Diagnosis not present

## 2016-02-28 DIAGNOSIS — E038 Other specified hypothyroidism: Secondary | ICD-10-CM | POA: Diagnosis not present

## 2017-03-01 DIAGNOSIS — G3184 Mild cognitive impairment, so stated: Secondary | ICD-10-CM | POA: Diagnosis not present

## 2017-03-01 DIAGNOSIS — E1121 Type 2 diabetes mellitus with diabetic nephropathy: Secondary | ICD-10-CM | POA: Diagnosis not present

## 2017-03-01 DIAGNOSIS — E038 Other specified hypothyroidism: Secondary | ICD-10-CM | POA: Diagnosis not present

## 2017-03-01 DIAGNOSIS — I10 Essential (primary) hypertension: Secondary | ICD-10-CM | POA: Diagnosis not present

## 2017-03-01 DIAGNOSIS — Z1389 Encounter for screening for other disorder: Secondary | ICD-10-CM | POA: Diagnosis not present

## 2017-03-01 DIAGNOSIS — Z0000 Encounter for general adult medical examination without abnormal findings: Secondary | ICD-10-CM | POA: Diagnosis not present

## 2017-03-23 DIAGNOSIS — E1121 Type 2 diabetes mellitus with diabetic nephropathy: Secondary | ICD-10-CM | POA: Diagnosis not present

## 2017-03-23 DIAGNOSIS — I10 Essential (primary) hypertension: Secondary | ICD-10-CM | POA: Diagnosis not present

## 2017-03-23 DIAGNOSIS — E038 Other specified hypothyroidism: Secondary | ICD-10-CM | POA: Diagnosis not present

## 2017-03-23 DIAGNOSIS — Z1389 Encounter for screening for other disorder: Secondary | ICD-10-CM | POA: Diagnosis not present

## 2017-03-23 DIAGNOSIS — G3184 Mild cognitive impairment, so stated: Secondary | ICD-10-CM | POA: Diagnosis not present

## 2017-03-23 DIAGNOSIS — Z0000 Encounter for general adult medical examination without abnormal findings: Secondary | ICD-10-CM | POA: Diagnosis not present

## 2018-07-08 DIAGNOSIS — I10 Essential (primary) hypertension: Secondary | ICD-10-CM | POA: Diagnosis not present

## 2018-07-08 DIAGNOSIS — Z1389 Encounter for screening for other disorder: Secondary | ICD-10-CM | POA: Diagnosis not present

## 2018-07-08 DIAGNOSIS — F0280 Dementia in other diseases classified elsewhere without behavioral disturbance: Secondary | ICD-10-CM | POA: Diagnosis not present

## 2018-07-08 DIAGNOSIS — Z0000 Encounter for general adult medical examination without abnormal findings: Secondary | ICD-10-CM | POA: Diagnosis not present

## 2018-07-08 DIAGNOSIS — G3184 Mild cognitive impairment, so stated: Secondary | ICD-10-CM | POA: Diagnosis not present

## 2018-07-08 DIAGNOSIS — E038 Other specified hypothyroidism: Secondary | ICD-10-CM | POA: Diagnosis not present

## 2018-07-08 DIAGNOSIS — E1121 Type 2 diabetes mellitus with diabetic nephropathy: Secondary | ICD-10-CM | POA: Diagnosis not present

## 2018-07-19 DIAGNOSIS — E038 Other specified hypothyroidism: Secondary | ICD-10-CM | POA: Diagnosis not present

## 2018-07-19 DIAGNOSIS — E1121 Type 2 diabetes mellitus with diabetic nephropathy: Secondary | ICD-10-CM | POA: Diagnosis not present

## 2018-07-19 DIAGNOSIS — Z0000 Encounter for general adult medical examination without abnormal findings: Secondary | ICD-10-CM | POA: Diagnosis not present

## 2018-07-19 DIAGNOSIS — I10 Essential (primary) hypertension: Secondary | ICD-10-CM | POA: Diagnosis not present

## 2018-07-30 DIAGNOSIS — E161 Other hypoglycemia: Secondary | ICD-10-CM | POA: Diagnosis not present

## 2018-07-30 DIAGNOSIS — R404 Transient alteration of awareness: Secondary | ICD-10-CM | POA: Diagnosis not present

## 2018-07-30 DIAGNOSIS — E162 Hypoglycemia, unspecified: Secondary | ICD-10-CM | POA: Diagnosis not present

## 2018-08-12 ENCOUNTER — Other Ambulatory Visit: Payer: Self-pay

## 2018-09-10 DIAGNOSIS — E11319 Type 2 diabetes mellitus with unspecified diabetic retinopathy without macular edema: Secondary | ICD-10-CM | POA: Diagnosis not present

## 2018-09-23 DIAGNOSIS — H35433 Paving stone degeneration of retina, bilateral: Secondary | ICD-10-CM | POA: Diagnosis not present

## 2018-10-01 DIAGNOSIS — I10 Essential (primary) hypertension: Secondary | ICD-10-CM | POA: Diagnosis not present

## 2018-10-01 DIAGNOSIS — E162 Hypoglycemia, unspecified: Secondary | ICD-10-CM | POA: Diagnosis not present

## 2018-10-01 DIAGNOSIS — E161 Other hypoglycemia: Secondary | ICD-10-CM | POA: Diagnosis not present

## 2018-10-10 ENCOUNTER — Encounter (HOSPITAL_COMMUNITY): Payer: Self-pay | Admitting: Internal Medicine

## 2018-10-10 ENCOUNTER — Other Ambulatory Visit: Payer: Self-pay

## 2018-10-10 ENCOUNTER — Inpatient Hospital Stay (HOSPITAL_COMMUNITY)
Admission: AD | Admit: 2018-10-10 | Discharge: 2018-10-14 | DRG: 481 | Disposition: A | Discharge status: Skilled Nursing Facility | Payer: Medicare Other | Source: Other Acute Inpatient Hospital | Attending: Internal Medicine | Admitting: Internal Medicine

## 2018-10-10 DIAGNOSIS — R41841 Cognitive communication deficit: Secondary | ICD-10-CM | POA: Diagnosis not present

## 2018-10-10 DIAGNOSIS — Z79891 Long term (current) use of opiate analgesic: Secondary | ICD-10-CM

## 2018-10-10 DIAGNOSIS — M25551 Pain in right hip: Secondary | ICD-10-CM | POA: Diagnosis not present

## 2018-10-10 DIAGNOSIS — R52 Pain, unspecified: Secondary | ICD-10-CM | POA: Diagnosis not present

## 2018-10-10 DIAGNOSIS — Y92009 Unspecified place in unspecified non-institutional (private) residence as the place of occurrence of the external cause: Secondary | ICD-10-CM

## 2018-10-10 DIAGNOSIS — E860 Dehydration: Secondary | ICD-10-CM | POA: Diagnosis not present

## 2018-10-10 DIAGNOSIS — Z20828 Contact with and (suspected) exposure to other viral communicable diseases: Secondary | ICD-10-CM | POA: Diagnosis present

## 2018-10-10 DIAGNOSIS — F0390 Unspecified dementia without behavioral disturbance: Secondary | ICD-10-CM | POA: Diagnosis present

## 2018-10-10 DIAGNOSIS — S72141A Displaced intertrochanteric fracture of right femur, initial encounter for closed fracture: Secondary | ICD-10-CM | POA: Diagnosis not present

## 2018-10-10 DIAGNOSIS — E785 Hyperlipidemia, unspecified: Secondary | ICD-10-CM | POA: Diagnosis present

## 2018-10-10 DIAGNOSIS — I10 Essential (primary) hypertension: Secondary | ICD-10-CM | POA: Diagnosis present

## 2018-10-10 DIAGNOSIS — M2550 Pain in unspecified joint: Secondary | ICD-10-CM | POA: Diagnosis not present

## 2018-10-10 DIAGNOSIS — Z7984 Long term (current) use of oral hypoglycemic drugs: Secondary | ICD-10-CM

## 2018-10-10 DIAGNOSIS — E119 Type 2 diabetes mellitus without complications: Secondary | ICD-10-CM

## 2018-10-10 DIAGNOSIS — R4182 Altered mental status, unspecified: Secondary | ICD-10-CM | POA: Diagnosis not present

## 2018-10-10 DIAGNOSIS — Z751 Person awaiting admission to adequate facility elsewhere: Secondary | ICD-10-CM

## 2018-10-10 DIAGNOSIS — I350 Nonrheumatic aortic (valve) stenosis: Secondary | ICD-10-CM | POA: Diagnosis present

## 2018-10-10 DIAGNOSIS — D649 Anemia, unspecified: Secondary | ICD-10-CM | POA: Diagnosis present

## 2018-10-10 DIAGNOSIS — Z885 Allergy status to narcotic agent status: Secondary | ICD-10-CM | POA: Diagnosis not present

## 2018-10-10 DIAGNOSIS — S72001A Fracture of unspecified part of neck of right femur, initial encounter for closed fracture: Secondary | ICD-10-CM | POA: Diagnosis present

## 2018-10-10 DIAGNOSIS — Z7901 Long term (current) use of anticoagulants: Secondary | ICD-10-CM

## 2018-10-10 DIAGNOSIS — S72144A Nondisplaced intertrochanteric fracture of right femur, initial encounter for closed fracture: Secondary | ICD-10-CM | POA: Diagnosis present

## 2018-10-10 DIAGNOSIS — R410 Disorientation, unspecified: Secondary | ICD-10-CM | POA: Diagnosis not present

## 2018-10-10 DIAGNOSIS — S73004A Unspecified dislocation of right hip, initial encounter: Secondary | ICD-10-CM

## 2018-10-10 DIAGNOSIS — S79921A Unspecified injury of right thigh, initial encounter: Secondary | ICD-10-CM | POA: Diagnosis not present

## 2018-10-10 DIAGNOSIS — Z90710 Acquired absence of both cervix and uterus: Secondary | ICD-10-CM

## 2018-10-10 DIAGNOSIS — Z79899 Other long term (current) drug therapy: Secondary | ICD-10-CM | POA: Diagnosis not present

## 2018-10-10 DIAGNOSIS — Z833 Family history of diabetes mellitus: Secondary | ICD-10-CM

## 2018-10-10 DIAGNOSIS — R011 Cardiac murmur, unspecified: Secondary | ICD-10-CM | POA: Diagnosis present

## 2018-10-10 DIAGNOSIS — K219 Gastro-esophageal reflux disease without esophagitis: Secondary | ICD-10-CM | POA: Diagnosis present

## 2018-10-10 DIAGNOSIS — S72144D Nondisplaced intertrochanteric fracture of right femur, subsequent encounter for closed fracture with routine healing: Secondary | ICD-10-CM | POA: Diagnosis not present

## 2018-10-10 DIAGNOSIS — Z9181 History of falling: Secondary | ICD-10-CM | POA: Diagnosis not present

## 2018-10-10 DIAGNOSIS — D509 Iron deficiency anemia, unspecified: Secondary | ICD-10-CM | POA: Diagnosis not present

## 2018-10-10 DIAGNOSIS — W1830XA Fall on same level, unspecified, initial encounter: Secondary | ICD-10-CM | POA: Diagnosis present

## 2018-10-10 DIAGNOSIS — W19XXXA Unspecified fall, initial encounter: Secondary | ICD-10-CM | POA: Diagnosis not present

## 2018-10-10 DIAGNOSIS — Z419 Encounter for procedure for purposes other than remedying health state, unspecified: Secondary | ICD-10-CM

## 2018-10-10 DIAGNOSIS — Z8249 Family history of ischemic heart disease and other diseases of the circulatory system: Secondary | ICD-10-CM | POA: Diagnosis not present

## 2018-10-10 DIAGNOSIS — M25572 Pain in left ankle and joints of left foot: Secondary | ICD-10-CM | POA: Diagnosis not present

## 2018-10-10 DIAGNOSIS — E7800 Pure hypercholesterolemia, unspecified: Secondary | ICD-10-CM | POA: Diagnosis not present

## 2018-10-10 DIAGNOSIS — R404 Transient alteration of awareness: Secondary | ICD-10-CM | POA: Diagnosis not present

## 2018-10-10 DIAGNOSIS — S72001D Fracture of unspecified part of neck of right femur, subsequent encounter for closed fracture with routine healing: Secondary | ICD-10-CM | POA: Diagnosis not present

## 2018-10-10 DIAGNOSIS — R2689 Other abnormalities of gait and mobility: Secondary | ICD-10-CM | POA: Diagnosis not present

## 2018-10-10 DIAGNOSIS — F0391 Unspecified dementia with behavioral disturbance: Secondary | ICD-10-CM | POA: Diagnosis not present

## 2018-10-10 DIAGNOSIS — N179 Acute kidney failure, unspecified: Secondary | ICD-10-CM | POA: Diagnosis not present

## 2018-10-10 DIAGNOSIS — M6281 Muscle weakness (generalized): Secondary | ICD-10-CM | POA: Diagnosis not present

## 2018-10-10 DIAGNOSIS — S0990XA Unspecified injury of head, initial encounter: Secondary | ICD-10-CM | POA: Diagnosis not present

## 2018-10-10 DIAGNOSIS — R294 Clicking hip: Secondary | ICD-10-CM | POA: Diagnosis not present

## 2018-10-10 DIAGNOSIS — R9431 Abnormal electrocardiogram [ECG] [EKG]: Secondary | ICD-10-CM | POA: Diagnosis present

## 2018-10-10 DIAGNOSIS — E876 Hypokalemia: Secondary | ICD-10-CM | POA: Diagnosis present

## 2018-10-10 DIAGNOSIS — Z7401 Bed confinement status: Secondary | ICD-10-CM | POA: Diagnosis not present

## 2018-10-10 HISTORY — DX: Anemia, unspecified: D64.9

## 2018-10-10 HISTORY — DX: Hyperlipidemia, unspecified: E78.5

## 2018-10-10 HISTORY — DX: Gastro-esophageal reflux disease without esophagitis: K21.9

## 2018-10-10 LAB — BRAIN NATRIURETIC PEPTIDE: B Natriuretic Peptide: 216.2 pg/mL — ABNORMAL HIGH (ref 0.0–100.0)

## 2018-10-10 LAB — PROTIME-INR
INR: 1.1 (ref 0.8–1.2)
Prothrombin Time: 13.9 seconds (ref 11.4–15.2)

## 2018-10-10 LAB — GLUCOSE, CAPILLARY: Glucose-Capillary: 161 mg/dL — ABNORMAL HIGH (ref 70–99)

## 2018-10-10 LAB — APTT: aPTT: 32 seconds (ref 24–36)

## 2018-10-10 MED ORDER — CHLORTHALIDONE 25 MG PO TABS: 25.0000 mg | ORAL_TABLET | Freq: Every day | ORAL | Status: DC

## 2018-10-10 MED ORDER — INSULIN ASPART 100 UNIT/ML ~~LOC~~ SOLN
0.0000 [IU] | Freq: Three times a day (TID) | SUBCUTANEOUS | Status: DC
  Administered 2018-10-11 (×2): 2 [IU] via SUBCUTANEOUS
  Administered 2018-10-11: 1 [IU] via SUBCUTANEOUS
  Administered 2018-10-12 (×2): 9 [IU] via SUBCUTANEOUS
  Administered 2018-10-12: 5 [IU] via SUBCUTANEOUS
  Administered 2018-10-13 – 2018-10-14 (×2): 2 [IU] via SUBCUTANEOUS
  Administered 2018-10-14: 15:00:00 5 [IU] via SUBCUTANEOUS

## 2018-10-10 MED ORDER — METHOCARBAMOL 500 MG PO TABS
500.0000 mg | ORAL_TABLET | Freq: Three times a day (TID) | ORAL | Status: DC | PRN
  Administered 2018-10-12: 500 mg via ORAL
  Filled 2018-10-10: qty 1

## 2018-10-10 MED ORDER — HYDRALAZINE HCL 25 MG PO TABS: 25.0000 mg | ORAL_TABLET | Freq: Three times a day (TID) | ORAL | Status: DC | PRN

## 2018-10-10 MED ORDER — ATENOLOL-CHLORTHALIDONE 50-25 MG PO TABS: 1.0000 | ORAL_TABLET | Freq: Every day | ORAL | Status: DC

## 2018-10-10 MED ORDER — ACETAMINOPHEN 325 MG PO TABS
650.0000 mg | ORAL_TABLET | Freq: Four times a day (QID) | ORAL | Status: DC | PRN
  Administered 2018-10-10 – 2018-10-13 (×5): 650 mg via ORAL
  Filled 2018-10-10 (×6): qty 2

## 2018-10-10 MED ORDER — MORPHINE SULFATE (PF) 2 MG/ML IV SOLN: 0.5000 mg | INTRAVENOUS | Status: DC | PRN

## 2018-10-10 MED ORDER — LEVOTHYROXINE SODIUM 75 MCG PO TABS
75.0000 ug | ORAL_TABLET | Freq: Every day | ORAL | Status: DC
  Administered 2018-10-11 – 2018-10-14 (×4): 75 ug via ORAL
  Filled 2018-10-10 (×4): qty 1

## 2018-10-10 MED ORDER — ATORVASTATIN CALCIUM 20 MG PO TABS
20.0000 mg | ORAL_TABLET | Freq: Every day | ORAL | Status: DC
  Administered 2018-10-11 – 2018-10-13 (×3): 20 mg via ORAL
  Filled 2018-10-10 (×3): qty 1

## 2018-10-10 MED ORDER — DONEPEZIL HCL 10 MG PO TABS
10.0000 mg | ORAL_TABLET | Freq: Every day | ORAL | Status: DC
  Administered 2018-10-11 – 2018-10-13 (×3): 10 mg via ORAL
  Filled 2018-10-10 (×4): qty 1

## 2018-10-10 MED ORDER — ONDANSETRON HCL 4 MG/2ML IJ SOLN
4.0000 mg | Freq: Three times a day (TID) | INTRAMUSCULAR | Status: DC | PRN
  Administered 2018-10-11: 15:00:00 4 mg via INTRAVENOUS

## 2018-10-10 MED ORDER — RISPERIDONE 0.25 MG PO TABS
0.5000 mg | ORAL_TABLET | Freq: Every day | ORAL | Status: DC
  Administered 2018-10-10: 0.5 mg via ORAL
  Filled 2018-10-10: qty 2

## 2018-10-10 MED ORDER — ATENOLOL 50 MG PO TABS
50.0000 mg | ORAL_TABLET | Freq: Every day | ORAL | Status: DC
  Administered 2018-10-11 – 2018-10-14 (×4): 50 mg via ORAL
  Filled 2018-10-10 (×4): qty 1

## 2018-10-10 MED ORDER — FERROUS SULFATE 325 (65 FE) MG PO TABS
325.0000 mg | ORAL_TABLET | Freq: Every day | ORAL | Status: DC
  Administered 2018-10-12 – 2018-10-14 (×3): 325 mg via ORAL
  Filled 2018-10-10 (×4): qty 1

## 2018-10-10 MED ORDER — CALCIUM CARBONATE-VITAMIN D 500-200 MG-UNIT PO TABS
1.0000 | ORAL_TABLET | Freq: Two times a day (BID) | ORAL | Status: DC
  Administered 2018-10-11 – 2018-10-14 (×7): 1 via ORAL
  Filled 2018-10-10 (×8): qty 1

## 2018-10-10 MED ORDER — AMLODIPINE BESYLATE 10 MG PO TABS
10.0000 mg | ORAL_TABLET | Freq: Every day | ORAL | Status: DC
  Administered 2018-10-11 – 2018-10-13 (×3): 10 mg via ORAL
  Filled 2018-10-10 (×4): qty 1

## 2018-10-10 MED ORDER — SENNOSIDES-DOCUSATE SODIUM 8.6-50 MG PO TABS: 1.0000 | ORAL_TABLET | Freq: Every evening | ORAL | Status: DC | PRN

## 2018-10-10 MED ORDER — INSULIN ASPART 100 UNIT/ML ~~LOC~~ SOLN
0.0000 [IU] | Freq: Every day | SUBCUTANEOUS | Status: DC
  Administered 2018-10-13: 21:00:00 1 [IU] via SUBCUTANEOUS

## 2018-10-10 MED ORDER — OXYCODONE-ACETAMINOPHEN 5-325 MG PO TABS
1.0000 | ORAL_TABLET | ORAL | Status: DC | PRN
  Administered 2018-10-14: 1 via ORAL
  Filled 2018-10-10 (×2): qty 1

## 2018-10-10 MED ORDER — CHOLECALCIFEROL 10 MCG (400 UNIT) PO TABS
400.0000 [IU] | ORAL_TABLET | Freq: Every day | ORAL | Status: DC
  Administered 2018-10-11 – 2018-10-14 (×4): 400 [IU] via ORAL
  Filled 2018-10-10 (×4): qty 1

## 2018-10-10 NOTE — H&P (Addendum)
History and Physical    Isabella Morris BCW:[id number] DOB: 1935-11-09 DOA: 10/10/2018  Referring MD/NP/PA:   PCP: Toma Deiters, MD   Patient coming from:  The patient is coming from home.  At baseline, pt is independent for most of ADL.        Chief Complaint: fall and right hip pain  HPI: Isabella Morris is a 83 y.o. female with medical history significant of hypertension, hyperlipidemia, diabetes mellitus, GERD, iron deficiency anemia, mild dementia, who presents with fall, right hip pain.  Patient is a transfer from [facility].  Pt states that she accidentally fell 3 days ago when she was about going outside. No LOC. Denies head or neck injury.  No headache or neck pain.  She developed right hip pain, which is constant, sharp, 6 out of 10 severity, nonradiating.  No leg numbness.  Patient denies chest pain, shortness breath, cough, fever or chills.  No nausea vomiting, diarrhea, abdominal pain, symptoms of UTI or unilateral weakness. CT scan in [facility] hospital showed nondisplaced intertrochanteric fracture of right hip. Ortho, Dr. Susa Simmonds was consulted.  ED Course: pt was found to have WBC 11.3, potassium 3.0 which was repleted, creatinine 1.07, BUN 34.  At arrival to floor, temperature normal, blood pressure 127/60, heart rate 96, oxygen saturation 97% on room air.  Patient is admitted to MedSurg bed as inpatient.  Review of Systems:   General: no fevers, chills, no body weight gain, has fatigue HEENT: no blurry vision, hearing changes or sore throat Respiratory: no dyspnea, coughing, wheezing CV: no chest pain, no palpitations GI: no nausea, vomiting, abdominal pain, diarrhea, constipation GU: no dysuria, burning on urination, increased urinary frequency, hematuria  Ext: no leg edema Neuro: no unilateral weakness, numbness, or tingling, no vision change or hearing loss. Had fall. Skin: no rash, no skin tear. MSK: has right hip pain Heme: No easy bruising.  Travel  history: No recent long distant travel.  Allergy:  Allergies  Allergen Reactions  . Codeine Nausea And Vomiting    Past Medical History:  Diagnosis Date  . Anemia   . Diabetes mellitus without complication (HCC)   . GERD (gastroesophageal reflux disease)   . HLD (hyperlipidemia)   . Hypertension     Past Surgical History:  Procedure Laterality Date  . ABDOMINAL HYSTERECTOMY    . ORIF HIP FRACTURE Left 08/05/2013   Procedure: OPEN REDUCTION INTERNAL FIXATION HIP;  Surgeon: Darreld Mclean, MD;  Location: [facility];  Service: Orthopedics;  Laterality: Left;    Social History:  reports that she has never smoked. She does not have any smokeless tobacco history on file. She reports that she does not drink alcohol or use drugs.  Family History:  Family History  Problem Relation Age of Onset  . Heart disease Sister   . Diabetes Mellitus II Sister      Prior to Admission medications   Medication Sig Start Date End Date Taking? Authorizing Provider  amLODipine (NORVASC) 10 MG tablet Take 10 mg by mouth daily. 07/09/13   [provider]  atenolol-chlorthalidone (TENORETIC) 50-25 MG per tablet Take 1 tablet by mouth daily. 08/04/13   [provider]  atorvastatin (LIPITOR) 20 MG tablet Take 20 mg by mouth daily. 05/30/13   [provider]  Calcium Carb-Cholecalciferol (CALTRATE 600+D) 600-800 MG-UNIT TABS Take 1 tablet by mouth 2 (two) times daily with breakfast and lunch.    [provider]  calcium carbonate (OS-CAL) 600 MG TABS tablet Take  600 mg by mouth 2 (two) times daily with a meal.    [provider]  cholecalciferol (VITAMIN D) 400 UNITS TABS tablet Take 400 Units by mouth.    [provider]  Chromium-Cinnamon 50-500 MCG-MG CAPS Take 2 capsules by mouth every morning.    [provider]  enoxaparin (LOVENOX) 40 MG/0.4ML injection Inject 0.4 mLs (40 mg total) into the skin daily. 08/08/13   Gwenyth Bender, NP  ferrous  sulfate 325 (65 FE) MG tablet Take 1 tablet (325 mg total) by mouth daily with breakfast. 08/08/13   Black, Lesle Chris, NP  furosemide (LASIX) 20 MG tablet Take 20 mg by mouth daily. 08/04/13   [provider]  metFORMIN (GLUCOPHAGE) 500 MG tablet Take 500 mg by mouth 2 (two) times daily. 05/08/13   [provider]  oxyCODONE-acetaminophen (PERCOCET/ROXICET) 5-325 MG per tablet Take 1 tablet by mouth every 4 (four) hours. 08/08/13   Black, Lesle Chris, NP  potassium chloride SA (K-DUR,KLOR-CON) 20 MEQ tablet Take 2 tablets (40 mEq total) by mouth daily. 08/08/13   Black, Lesle Chris, NP  ranitidine (ZANTAC) 150 MG tablet Take 150 mg by mouth 2 (two) times daily. 05/08/13   [provider]  repaglinide (PRANDIN) 2 MG tablet Take 2 mg by mouth 2 (two) times daily. 05/19/13   [provider]  senna-docusate (SENOKOT-S) 8.6-50 MG per tablet Take 1 tablet by mouth 2 (two) times daily. 08/08/13   Black, Lesle Chris, NP  sodium bicarbonate 650 MG tablet Take 650 mg by mouth 2 (two) times daily.    [provider]    Physical Exam: Vitals:   10/10/18 2035  BP: 127/60  Pulse: 96  Resp: 18  Temp: 98.2 F (36.8 C)  TempSrc: Oral  SpO2: 97%   General: Not in acute distress HEENT:       Eyes: PERRL, EOMI, no scleral icterus.       ENT: No discharge from the ears and nose, no pharynx injection, no tonsillar enlargement.        Neck: No JVD, no bruit, no mass felt. Heme: No neck lymph node enlargement. Cardiac: S1/S2, RRR, No gallops or rubs. Respiratory: No rales, wheezing, rhonchi or rubs. GI: Soft, nondistended, nontender, no rebound pain, no organomegaly, BS present. GU: No hematuria Ext: No pitting leg edema bilaterally. 2+DP/PT pulse bilaterally. Musculoskeletal: has right hip tenderness. Skin: No rashes.  Neuro: Alert, oriented X3, cranial nerves II-XII grossly intact, moves all extremities Psych: Patient is not psychotic, no suicidal or hemocidal ideation.  Labs  on Admission: I have personally reviewed following labs and imaging studies  CBC: No results for input(s): WBC, NEUTROABS, HGB, HCT, MCV, PLT in the last 168 hours. Basic Metabolic Panel: Recent Labs  Lab 10/10/18 2348  NA 136  K 3.0*  CL 101  CO2 22  GLUCOSE 217*  BUN 31*  CREATININE 1.10*  CALCIUM 8.5*   GFR: CrCl cannot be calculated (Unknown ideal weight.). Liver Function Tests: No results for input(s): AST, ALT, ALKPHOS, BILITOT, PROT, ALBUMIN in the last 168 hours. No results for input(s): LIPASE, AMYLASE in the last 168 hours. No results for input(s): AMMONIA in the last 168 hours. Coagulation Profile: Recent Labs  Lab 10/10/18 2131  INR 1.1   Cardiac Enzymes: No results for input(s): CKTOTAL, CKMB, CKMBINDEX, TROPONINI in the last 168 hours. BNP (last 3 results) No results for input(s): PROBNP in the last 8760 hours. HbA1C: No results for input(s): HGBA1C in the last  72 hours. CBG: Recent Labs  Lab 10/10/18 2317  GLUCAP 161*   Lipid Profile: No results for input(s): CHOL, HDL, LDLCALC, TRIG, CHOLHDL, LDLDIRECT in the last 72 hours. Thyroid Function Tests: No results for input(s): TSH, T4TOTAL, FREET4, T3FREE, THYROIDAB in the last 72 hours. Anemia Panel: No results for input(s): VITAMINB12, FOLATE, FERRITIN, TIBC, IRON, RETICCTPCT in the last 72 hours. Urine analysis: No results found for: COLORURINE, APPEARANCEUR, LABSPEC, PHURINE, GLUCOSEU, HGBUR, BILIRUBINUR, KETONESUR, PROTEINUR, UROBILINOGEN, NITRITE, LEUKOCYTESUR Sepsis Labs: @LABRCNTIP (procalcitonin:4,lacticidven:4) ) Recent Results (from the past 240 hour(s))  SARS Coronavirus 2 [facility](Hospital order, Performed in [facility] hospital lab) Nasopharyngeal Nasopharyngeal Swab     Status: None   Collection Time: 10/10/18  9:24 PM   Specimen: Nasopharyngeal Swab  Result Value Ref Range Status   SARS Coronavirus 2 NEGATIVE NEGATIVE Final    Comment: (NOTE) If result is NEGATIVE SARS-CoV-2 target  nucleic acids are NOT DETECTED. The SARS-CoV-2 RNA is generally detectable in upper and lower  respiratory specimens during the acute phase of infection. The lowest  concentration of SARS-CoV-2 viral copies this assay can detect is 250  copies / mL. A negative result does not preclude SARS-CoV-2 infection  and should not be used as the sole basis for treatment or other  patient management decisions.  A negative result may occur with  improper specimen collection / handling, submission of specimen other  than nasopharyngeal swab, presence of viral mutation(s) within the  areas targeted by this assay, and inadequate number of viral copies  (<250 copies / mL). A negative result must be combined with clinical  observations, patient history, and epidemiological information. If result is POSITIVE SARS-CoV-2 target nucleic acids are DETECTED. The SARS-CoV-2 RNA is generally detectable in upper and lower  respiratory specimens dur ing the acute phase of infection.  Positive  results are indicative of active infection with SARS-CoV-2.  Clinical  correlation with patient history and other diagnostic information is  necessary to determine patient infection status.  Positive results do  not rule out bacterial infection or co-infection with other viruses. If result is PRESUMPTIVE POSTIVE SARS-CoV-2 nucleic acids MAY BE PRESENT.   A presumptive positive result was obtained on the submitted specimen  and confirmed on repeat testing.  While 2019 novel coronavirus  (SARS-CoV-2) nucleic acids may be present in the submitted sample  additional confirmatory testing may be necessary for epidemiological  and / or clinical management purposes  to differentiate between  SARS-CoV-2 and other Sarbecovirus currently known to infect humans.  If clinically indicated additional testing with an alternate test  methodology [id number]) is advised. The SARS-CoV-2 RNA is generally  detectable in upper and lower  respiratory sp ecimens during the acute  phase of infection. The expected result is Negative. Fact Sheet for Patients:  BoilerBrush.com.cyhttps://www.fda.gov/media/136312/download Fact Sheet for Healthcare Providers: https://pope.com/https://www.fda.gov/media/136313/download This test is not yet approved or cleared by the [place] FDA and has been authorized for detection and/or diagnosis of SARS-CoV-2 by FDA under an Emergency Use Authorization (EUA).  This EUA will remain in effect (meaning this test can be used) for the duration of the COVID-19 declaration under Section 564(b)(1) of the Act, 21 U.S.C. section 360bbb-3(b)(1), unless the authorization is terminated or revoked sooner. Performed at [facility], [address] [place], [place] [address]      Radiological Exams on Admission: No results found.   EKG: Independently reviewed.  Not done in ED, will get one.   Assessment/Plan Principal Problem:   Closed  right hip fracture (HCC) Active Problems:   Hypertension   HLD (hyperlipidemia)   Diabetes mellitus without complication (HCC)   Anemia   Hypokalemia   Dementia (HCC)   Fall   Closed right hip fracture -nondisplaced intratrochanteric fracture:   As evidenced by CT scan. Patient has moderate pain now. No neurovascular compromise. Orthopedic surgeon, Dr. Lucia Gaskins was consulted.   - will admit to Med-surg bed as inpt - Pain control: morphine prn and percocet - When necessary Zofran for nausea - Robaxin for muscle spasm - type and cross - INR/PTT - PT/OT when able to (not ordered now)  Leukocytosis: mild, WBC 3.0. Likely due to stress-induced demargination. Patient does not have signs of infection. -Follow-up CBC  Fall: Seems to have had a mechanical fall. -Will get CT head -Pt/ot when able to  HTN:  -Continue home medications: Amlodipine -IV hydralazine prn  Dementia:  -Donepezil  HLD (hyperlipidemia): -Lipitor  Diabetes mellitus without complication ([place]):  No U5K on record. Pt is taking prandin and metformin at home -SSI -check A1c  Anemia: Hgb 14.0. -on iron supplement  Hypokalemia: repeated BMP showed K 3.0.  - Repleted with 60 mEQ of Kdur - Check Mg level - Give 1 g of magnesium sulfate   Perioperative Cardiac Risk: He has multiple comorbidities, but denies hx of CAD, heart attack or CHF.    Currently patient is active and independent of his ADLs, IADLs. He is on Bp med and Lipitor at home. No recent acute cardiac issues.  Patient does not have chest pain, shortness of breath, palpitation, leg edema.  No signs of acute CHF exacerbation currently. At this time point, no further work up is needed. His GUPTA score perioperative myocardial infarction or cardaic arrest is 0.84% which is moderate risk.  Inpatient status:  # Patient requires inpatient status due to high intensity of service, high risk for further deterioration and high frequency of surveillance required.  I certify that at the point of admission it is my clinical judgment that the patient will require inpatient hospital care spanning beyond 2 midnights from the point of admission.  . This patient has multiple chronic comorbidities including hypertension, hyperlipidemia, diabetes mellitus, GERD, iron deficiency anemia, mild dementia . Now patient has presenting with fall and right hip fracture . The worrisome physical exam findings include right hip tenderness . The initial radiographic and laboratory data are worrisome because of hypokalemia, right intertrochanteric hip fracture by CT scan . Current medical needs: please see my assessment and plan . Predictability of an adverse outcome (risk): Patient's multiple comorbidities, now presents with fall and right hip fracture.  Patient will need surgery.  Given her old age, patient is at high risk for developing complications.  Patient will need to be treated in hospital for at least 2 days.       DVT ppx: SCD Code Status:  Full code Family Communication: None at bed side.  Disposition Plan:  Anticipate discharge back to previous home environment Consults called:  Dr. Lucia Gaskins Admission status:   medical floor/inpt  Date of Service 10/11/2018    [facility]   If 7PM-7AM, please contact night-coverage [email or web address] Password [email or web address] 10/11/2018, 12:29 AM

## 2018-10-10 NOTE — Consult Note (Signed)
Brief orthopedic consult note. Full consult note to follow  Isabella Morris is an 83 year old female with a history of dementia who was transferred from [facility] with concern for hip fracture.  I was called by the ED physician prior to transfer.  I was also told by her that patient and family wished to be seen by the surgeon who did her previous surgery.  This was Dr. Luna Glasgow with [facility].  Her original surgery was done in 2015.  I attempted to call the family with no answer.  I requested nursing upload her images from [facility] rocking him to our system for viewing purposes.  I was told by the ER physician that she had an intertrochanteric hip fracture and will review the images to confirm once they are available.  If she does in fact have this fracture she would likely be a candidate for cephalomedullary nail fixation.  We will attempt to reach out to the family members in the a.m.  So far we will plan for surgery tomorrow afternoon if I am able to reach the family.

## 2018-10-10 NOTE — Progress Notes (Signed)
Patient arrived from [facility] via stretcher escorted by EMS; pt is AOx3, VS stable; no signs of distress noted; pt stated no pain at this time; Hospitalist paged; family informed; will cont to monitor pt;

## 2018-10-11 ENCOUNTER — Inpatient Hospital Stay (HOSPITAL_COMMUNITY): Payer: Medicare Other | Admitting: Registered Nurse

## 2018-10-11 ENCOUNTER — Inpatient Hospital Stay (HOSPITAL_COMMUNITY): Payer: Medicare Other

## 2018-10-11 ENCOUNTER — Encounter (HOSPITAL_COMMUNITY): Payer: Self-pay

## 2018-10-11 ENCOUNTER — Other Ambulatory Visit: Payer: Self-pay

## 2018-10-11 ENCOUNTER — Encounter (HOSPITAL_COMMUNITY)
Admission: AD | Disposition: A | Discharge status: Skilled Nursing Facility | Payer: Self-pay | Source: Other Acute Inpatient Hospital | Attending: Internal Medicine

## 2018-10-11 DIAGNOSIS — W19XXXA Unspecified fall, initial encounter: Secondary | ICD-10-CM | POA: Diagnosis present

## 2018-10-11 DIAGNOSIS — F0390 Unspecified dementia without behavioral disturbance: Secondary | ICD-10-CM

## 2018-10-11 DIAGNOSIS — S72001D Fracture of unspecified part of neck of right femur, subsequent encounter for closed fracture with routine healing: Secondary | ICD-10-CM

## 2018-10-11 DIAGNOSIS — E876 Hypokalemia: Secondary | ICD-10-CM

## 2018-10-11 HISTORY — PX: INTRAMEDULLARY (IM) NAIL INTERTROCHANTERIC: SHX5875

## 2018-10-11 LAB — GLUCOSE, CAPILLARY
Glucose-Capillary: 182 mg/dL — ABNORMAL HIGH (ref 70–99)
Glucose-Capillary: 134 mg/dL — ABNORMAL HIGH (ref 70–99)
Glucose-Capillary: 134 mg/dL — ABNORMAL HIGH (ref 70–99)
Glucose-Capillary: 159 mg/dL — ABNORMAL HIGH (ref 70–99)
Glucose-Capillary: 177 mg/dL — ABNORMAL HIGH (ref 70–99)
Glucose-Capillary: 182 mg/dL — ABNORMAL HIGH (ref 70–99)

## 2018-10-11 LAB — MAGNESIUM: Magnesium: 2.5 mg/dL — ABNORMAL HIGH (ref 1.7–2.4)

## 2018-10-11 LAB — HEMOGLOBIN A1C
Hgb A1c MFr Bld: 8.8 % — ABNORMAL HIGH (ref 4.8–5.6)
Mean Plasma Glucose: 205.86 mg/dL

## 2018-10-11 LAB — CBC
HCT: 42 % (ref 36.0–46.0)
Hemoglobin: 13 g/dL (ref 12.0–15.0)
MCH: 28.3 pg (ref 26.0–34.0)
MCHC: 31 g/dL (ref 30.0–36.0)
MCV: 91.5 fL (ref 80.0–100.0)
Platelets: 178 10*3/uL (ref 150–400)
RBC: 4.59 MIL/uL (ref 3.87–5.11)
RDW: 13.5 % (ref 11.5–15.5)
WBC: 9.7 10*3/uL (ref 4.0–10.5)
nRBC: 0 % (ref 0.0–0.2)

## 2018-10-11 LAB — BASIC METABOLIC PANEL
Anion gap: 12 (ref 5–15)
Anion gap: 13 (ref 5–15)
BUN: 31 mg/dL — ABNORMAL HIGH (ref 8–23)
BUN: 31 mg/dL — ABNORMAL HIGH (ref 8–23)
CO2: 21 mmol/L — ABNORMAL LOW (ref 22–32)
CO2: 22 mmol/L (ref 22–32)
Calcium: 8.2 mg/dL — ABNORMAL LOW (ref 8.9–10.3)
Calcium: 8.5 mg/dL — ABNORMAL LOW (ref 8.9–10.3)
Chloride: 101 mmol/L (ref 98–111)
Chloride: 102 mmol/L (ref 98–111)
Creatinine, Ser: 1.08 mg/dL — ABNORMAL HIGH (ref 0.44–1.00)
Creatinine, Ser: 1.1 mg/dL — ABNORMAL HIGH (ref 0.44–1.00)
GFR calc Af Amer: 55 mL/min — ABNORMAL LOW (ref >60)
GFR calc Af Amer: 56 mL/min — ABNORMAL LOW (ref >60)
GFR calc non Af Amer: 47 mL/min — ABNORMAL LOW (ref >60)
GFR calc non Af Amer: 48 mL/min — ABNORMAL LOW (ref >60)
Glucose, Bld: 217 mg/dL — ABNORMAL HIGH (ref 70–99)
Glucose, Bld: 241 mg/dL — ABNORMAL HIGH (ref 70–99)
Potassium: 3 mmol/L — ABNORMAL LOW (ref 3.5–5.1)
Potassium: 3.5 mmol/L (ref 3.5–5.1)
Sodium: 135 mmol/L (ref 135–145)
Sodium: 136 mmol/L (ref 135–145)

## 2018-10-11 LAB — SURGICAL PCR SCREEN
MRSA, PCR: NEGATIVE
Staphylococcus aureus: POSITIVE — AB

## 2018-10-11 LAB — INFLUENZA PANEL BY PCR (TYPE A & B)
Influenza A By PCR: NEGATIVE
Influenza B By PCR: NEGATIVE

## 2018-10-11 LAB — SARS CORONAVIRUS 2 BY RT PCR (HOSPITAL ORDER, PERFORMED IN ~~LOC~~ HOSPITAL LAB): SARS Coronavirus 2: NEGATIVE

## 2018-10-11 SURGERY — FIXATION, FRACTURE, INTERTROCHANTERIC, WITH INTRAMEDULLARY ROD
Anesthesia: General | Site: Hip | Laterality: Right

## 2018-10-11 MED ORDER — PROPOFOL 10 MG/ML IV BOLUS
INTRAVENOUS | Status: AC
  Filled 2018-10-11: qty 20

## 2018-10-11 MED ORDER — PHENYLEPHRINE 40 MCG/ML (10ML) SYRINGE FOR IV PUSH (FOR BLOOD PRESSURE SUPPORT)
PREFILLED_SYRINGE | INTRAVENOUS | Status: DC | PRN
  Administered 2018-10-11: 120 ug via INTRAVENOUS
  Administered 2018-10-11 (×2): 80 ug via INTRAVENOUS

## 2018-10-11 MED ORDER — LIDOCAINE 2% (20 MG/ML) 5 ML SYRINGE
INTRAMUSCULAR | Status: AC
  Filled 2018-10-11: qty 5

## 2018-10-11 MED ORDER — FENTANYL CITRATE (PF) 100 MCG/2ML IJ SOLN
INTRAMUSCULAR | Status: AC
  Filled 2018-10-11: qty 2

## 2018-10-11 MED ORDER — PROMETHAZINE HCL 25 MG/ML IJ SOLN: 6.2500 mg | INTRAMUSCULAR | Status: DC | PRN

## 2018-10-11 MED ORDER — LACTATED RINGERS IV SOLN
INTRAVENOUS | Status: DC | PRN
  Administered 2018-10-11: 14:00:00 via INTRAVENOUS

## 2018-10-11 MED ORDER — PHENYLEPHRINE 40 MCG/ML (10ML) SYRINGE FOR IV PUSH (FOR BLOOD PRESSURE SUPPORT)
PREFILLED_SYRINGE | INTRAVENOUS | Status: AC
  Filled 2018-10-11: qty 10

## 2018-10-11 MED ORDER — ROCURONIUM BROMIDE 10 MG/ML (PF) SYRINGE
PREFILLED_SYRINGE | INTRAVENOUS | Status: AC
  Filled 2018-10-11: qty 10

## 2018-10-11 MED ORDER — PROPOFOL 10 MG/ML IV BOLUS
INTRAVENOUS | Status: DC | PRN
  Administered 2018-10-11: 90 mg via INTRAVENOUS

## 2018-10-11 MED ORDER — POVIDONE-IODINE 10 % EX SWAB: 2.0000 "application " | Freq: Once | CUTANEOUS | Status: DC

## 2018-10-11 MED ORDER — CEFAZOLIN SODIUM-DEXTROSE 2-4 GM/100ML-% IV SOLN
2.0000 g | INTRAVENOUS | Status: AC
  Administered 2018-10-11: 2 g via INTRAVENOUS
  Filled 2018-10-11: qty 100

## 2018-10-11 MED ORDER — ROCURONIUM BROMIDE 10 MG/ML (PF) SYRINGE
PREFILLED_SYRINGE | INTRAVENOUS | Status: DC | PRN
  Administered 2018-10-11: 50 mg via INTRAVENOUS

## 2018-10-11 MED ORDER — HYDROMORPHONE HCL 1 MG/ML IJ SOLN: 0.2500 mg | INTRAMUSCULAR | Status: DC | PRN

## 2018-10-11 MED ORDER — 0.9 % SODIUM CHLORIDE (POUR BTL) OPTIME
TOPICAL | Status: DC | PRN
  Administered 2018-10-11: 15:00:00 1000 mL

## 2018-10-11 MED ORDER — MAGNESIUM SULFATE IN D5W 1-5 GM/100ML-% IV SOLN
1.0000 g | Freq: Once | INTRAVENOUS | Status: AC
  Administered 2018-10-11: 1 g via INTRAVENOUS
  Filled 2018-10-11: qty 100

## 2018-10-11 MED ORDER — SUGAMMADEX SODIUM 200 MG/2ML IV SOLN
INTRAVENOUS | Status: DC | PRN
  Administered 2018-10-11: 200 mg via INTRAVENOUS

## 2018-10-11 MED ORDER — DEXAMETHASONE SODIUM PHOSPHATE 10 MG/ML IJ SOLN
INTRAMUSCULAR | Status: DC | PRN
  Administered 2018-10-11: 5 mg via INTRAVENOUS

## 2018-10-11 MED ORDER — EPHEDRINE 5 MG/ML INJ
INTRAVENOUS | Status: AC
  Filled 2018-10-11: qty 10

## 2018-10-11 MED ORDER — CEFAZOLIN SODIUM-DEXTROSE 1-4 GM/50ML-% IV SOLN
1.0000 g | Freq: Three times a day (TID) | INTRAVENOUS | Status: AC
  Administered 2018-10-11 – 2018-10-12 (×3): 1 g via INTRAVENOUS
  Filled 2018-10-11 (×3): qty 50

## 2018-10-11 MED ORDER — SODIUM CHLORIDE 0.9 % IV SOLN
INTRAVENOUS | Status: DC | PRN
  Administered 2018-10-11: 500 mL via INTRAVENOUS

## 2018-10-11 MED ORDER — LIDOCAINE 2% (20 MG/ML) 5 ML SYRINGE
INTRAMUSCULAR | Status: DC | PRN
  Administered 2018-10-11: 60 mg via INTRAVENOUS

## 2018-10-11 MED ORDER — POTASSIUM CHLORIDE CRYS ER 20 MEQ PO TBCR
60.0000 meq | EXTENDED_RELEASE_TABLET | Freq: Once | ORAL | Status: AC
  Administered 2018-10-11: 60 meq via ORAL
  Filled 2018-10-11: qty 3

## 2018-10-11 MED ORDER — OXYCODONE HCL 5 MG PO TABS: 5.0000 mg | ORAL_TABLET | Freq: Once | ORAL | Status: DC | PRN

## 2018-10-11 MED ORDER — CHLORHEXIDINE GLUCONATE 4 % EX LIQD
60.0000 mL | Freq: Once | CUTANEOUS | Status: AC
  Administered 2018-10-11: 4 via TOPICAL

## 2018-10-11 MED ORDER — OXYCODONE HCL 5 MG/5ML PO SOLN: 5.0000 mg | Freq: Once | ORAL | Status: DC | PRN

## 2018-10-11 MED ORDER — FENTANYL CITRATE (PF) 100 MCG/2ML IJ SOLN
INTRAMUSCULAR | Status: DC | PRN
  Administered 2018-10-11: 50 ug via INTRAVENOUS
  Administered 2018-10-11: 100 ug via INTRAVENOUS

## 2018-10-11 MED ORDER — EPHEDRINE SULFATE-NACL 50-0.9 MG/10ML-% IV SOSY
PREFILLED_SYRINGE | INTRAVENOUS | Status: DC | PRN
  Administered 2018-10-11 (×2): 5 mg via INTRAVENOUS

## 2018-10-11 SURGICAL SUPPLY — 37 items
BAG ZIPLOCK 12X15 (MISCELLANEOUS) ×2 IMPLANT
BIT DRILL CANN LG 4.3MM (BIT) IMPLANT
BNDG COHESIVE 6X5 TAN STRL LF (GAUZE/BANDAGES/DRESSINGS) ×2 IMPLANT
BNDG GAUZE ELAST 4 BULKY (GAUZE/BANDAGES/DRESSINGS) ×1 IMPLANT
BOOTIES KNEE HIGH SLOAN (MISCELLANEOUS) ×3 IMPLANT
CHLORAPREP W/TINT 26 (MISCELLANEOUS) ×3 IMPLANT
COVER PERINEAL POST (MISCELLANEOUS) ×3 IMPLANT
COVER SURGICAL LIGHT HANDLE (MISCELLANEOUS) ×3 IMPLANT
COVER WAND RF STERILE (DRAPES) IMPLANT
DRAPE C-ARM 42X120 X-RAY (DRAPES) ×3 IMPLANT
DRAPE SHEET LG 3/4 BI-LAMINATE (DRAPES) ×5 IMPLANT
DRAPE STERI IOBAN 125X83 (DRAPES) ×3 IMPLANT
DRILL BIT CANN LG 4.3MM (BIT) ×3
DRSG MEPILEX BORDER 4X4 (GAUZE/BANDAGES/DRESSINGS) ×6 IMPLANT
DRSG TEGADERM 4X4.75 (GAUZE/BANDAGES/DRESSINGS) ×4 IMPLANT
ELECT REM PT RETURN 15FT ADLT (MISCELLANEOUS) ×3 IMPLANT
GAUZE SPONGE 4X4 12PLY STRL (GAUZE/BANDAGES/DRESSINGS) ×1 IMPLANT
GLOVE BIOGEL M STRL SZ7.5 (GLOVE) ×3 IMPLANT
GLOVE BIOGEL PI IND STRL 8 (GLOVE) ×1 IMPLANT
GLOVE BIOGEL PI INDICATOR 8 (GLOVE) ×2
GOWN STRL REUS W/TWL XL LVL3 (GOWN DISPOSABLE) ×3 IMPLANT
GUIDEPIN 3.2X17.5 THRD DISP (PIN) ×4 IMPLANT
HIP FR NAIL LAG SCREW 10.5X110 (Orthopedic Implant) ×3 IMPLANT
KIT BASIN OR (CUSTOM PROCEDURE TRAY) ×3 IMPLANT
KIT TURNOVER KIT A (KITS) ×2 IMPLANT
MANIFOLD NEPTUNE II (INSTRUMENTS) ×3 IMPLANT
NAIL HIP FRACT 130D 11X180 (Screw) ×2 IMPLANT
PACK GENERAL/GYN (CUSTOM PROCEDURE TRAY) ×3 IMPLANT
PROTECTOR NERVE ULNAR (MISCELLANEOUS) ×7 IMPLANT
SCREW BONE CORTICAL 5.0X40 (Screw) ×2 IMPLANT
SCREW LAG HIP FR NAIL 10.5X110 (Orthopedic Implant) IMPLANT
STAPLER VISISTAT 35W (STAPLE) ×3 IMPLANT
SUT MNCRL AB 3-0 PS2 18 (SUTURE) ×3 IMPLANT
SUT MON AB 2-0 SH 27 (SUTURE) ×2
SUT MON AB 2-0 SH27 (SUTURE) IMPLANT
TOWEL OR 17X26 10 PK STRL BLUE (TOWEL DISPOSABLE) ×3 IMPLANT
WATER STERILE IRR 1000ML POUR (IV SOLUTION) ×2 IMPLANT

## 2018-10-11 NOTE — Anesthesia Procedure Notes (Signed)
Procedure Name: Intubation Date/Time: 10/11/2018 2:40 PM Performed by: Claudia Desanctis, CRNA Pre-anesthesia Checklist: Patient identified, Emergency Drugs available, Suction available and Patient being monitored Patient Re-evaluated:Patient Re-evaluated prior to induction Oxygen Delivery Method: Circle system utilized Preoxygenation: Pre-oxygenation with 100% oxygen Induction Type: IV induction Ventilation: Mask ventilation without difficulty Laryngoscope Size: 2 and Miller Grade View: Grade I Tube type: Oral Tube size: 7.0 mm Number of attempts: 1 Airway Equipment and Method: Stylet Placement Confirmation: ETT inserted through vocal cords under direct vision,  positive ETCO2 and breath sounds checked- equal and bilateral Secured at: 21 cm Tube secured with: Tape Dental Injury: Teeth and Oropharynx as per pre-operative assessment

## 2018-10-11 NOTE — Consult Note (Signed)
Reason for Consult: Right hip fracture Referring Physician: [facility]  Isabella Morris is an 83 y.o. female.  HPI: Patient was transferred from [place] last night with an occult right intertrochanteric hip fracture per MRI report.  Patient was admitted to the hospitalist service.  She has a history of dementia and diabetes with a recent A1c of 8.8.  Given the nature of her fracture orthopedics was consulted for evaluation.  Patient is ambulatory at baseline and is able to perform ADLs despite dementia.  She complains of pain in her right hip.  Denies any left hip pain.  Denies any right or left upper extremity pain.  She did have surgery on her left hip about 5 years ago and did well from this.  Denies any recent fevers or chills.  Past Medical History:  Diagnosis Date  . Anemia   . Diabetes mellitus without complication ([place])   . GERD (gastroesophageal reflux disease)   . HLD (hyperlipidemia)   . Hypertension     Past Surgical History:  Procedure Laterality Date  . ABDOMINAL HYSTERECTOMY    . ORIF HIP FRACTURE Left 08/05/2013   Procedure: OPEN REDUCTION INTERNAL FIXATION HIP;  Surgeon: Sanjuana Kava, MD;  Location: [facility];  Service: Orthopedics;  Laterality: Left;    Family History  Problem Relation Age of Onset  . Heart disease Sister   . Diabetes Mellitus II Sister     Social History:  reports that she has never smoked. She does not have any smokeless tobacco history on file. She reports that she does not drink alcohol or use drugs.  Allergies:  Allergies  Allergen Reactions  . Codeine Nausea And Vomiting    Medications: I have reviewed the patient's current medications.  Results for orders placed or performed during the hospital encounter of 10/10/18 (from the past 48 hour(s))  SARS Coronavirus 2 [facility] order, Performed in [facility] hospital lab) Nasopharyngeal Nasopharyngeal Swab     Status: None   Collection Time: 10/10/18  9:24 PM   Specimen:  Nasopharyngeal Swab  Result Value Ref Range   SARS Coronavirus 2 NEGATIVE NEGATIVE    Comment: (NOTE) If result is NEGATIVE SARS-CoV-2 target nucleic acids are NOT DETECTED. The SARS-CoV-2 RNA is generally detectable in upper and lower  respiratory specimens during the acute phase of infection. The lowest  concentration of SARS-CoV-2 viral copies this assay can detect is 250  copies / mL. A negative result does not preclude SARS-CoV-2 infection  and should not be used as the sole basis for treatment or other  patient management decisions.  A negative result may occur with  improper specimen collection / handling, submission of specimen other  than nasopharyngeal swab, presence of viral mutation(s) within the  areas targeted by this assay, and inadequate number of viral copies  (<250 copies / mL). A negative result must be combined with clinical  observations, patient history, and epidemiological information. If result is POSITIVE SARS-CoV-2 target nucleic acids are DETECTED. The SARS-CoV-2 RNA is generally detectable in upper and lower  respiratory specimens dur ing the acute phase of infection.  Positive  results are indicative of active infection with SARS-CoV-2.  Clinical  correlation with patient history and other diagnostic information is  necessary to determine patient infection status.  Positive results do  not rule out bacterial infection or co-infection with other viruses. If result is PRESUMPTIVE POSTIVE SARS-CoV-2 nucleic acids MAY BE PRESENT.   A presumptive positive result was obtained on the submitted specimen  and confirmed on repeat testing.  While 2019 novel coronavirus  (SARS-CoV-2) nucleic acids may be present in the submitted sample  additional confirmatory testing may be necessary for epidemiological  and / or clinical management purposes  to differentiate between  SARS-CoV-2 and other Sarbecovirus currently known to infect humans.  If clinically indicated  additional testing with an alternate test  methodology [phone]) is advised. The SARS-CoV-2 RNA is generally  detectable in upper and lower respiratory sp ecimens during the acute  phase of infection. The expected result is Negative. Fact Sheet for Patients:  BoilerBrush.com.cy Fact Sheet for Healthcare Providers: https://pope.com/ This test is not yet approved or cleared by the [place] FDA and has been authorized for detection and/or diagnosis of SARS-CoV-2 by FDA under an Emergency Use Authorization (EUA).  This EUA will remain in effect (meaning this test can be used) for the duration of the COVID-19 declaration under Section 564(b)(1) of the Act, 21 U.S.C. section 360bbb-3(b)(1), unless the authorization is terminated or revoked sooner. Performed at [facility], [address], [place], [place] [address]   Protime-INR     Status: None   Collection Time: 10/10/18  9:31 PM  Result Value Ref Range   Prothrombin Time 13.9 11.4 - 15.2 seconds   INR 1.1 0.8 - 1.2    Comment: (NOTE) INR goal varies based on device and disease states. Performed at [facility], [address], [place], [place] [address]   APTT     Status: None   Collection Time: 10/10/18  9:31 PM  Result Value Ref Range   aPTT 32 24 - 36 seconds    Comment: Performed at [facility], [address], [place], [place] [address]  Brain natriuretic peptide     Status: Abnormal   Collection Time: 10/10/18  9:31 PM  Result Value Ref Range   B Natriuretic Peptide 216.2 (H) 0.0 - 100.0 pg/mL    Comment: Performed at [facility], [address] [place], [place] [address]  Glucose, capillary     Status: Abnormal   Collection Time: 10/10/18 11:17 PM  Result Value Ref Range   Glucose-Capillary 161 (H) 70 - 99 mg/dL  Basic metabolic panel     Status: Abnormal   Collection Time: 10/10/18 11:48  PM  Result Value Ref Range   Sodium 136 135 - 145 mmol/L   Potassium 3.0 (L) 3.5 - 5.1 mmol/L   Chloride 101 98 - 111 mmol/L   CO2 22 22 - 32 mmol/L   Glucose, Bld 217 (H) 70 - 99 mg/dL   BUN 31 (H) 8 - 23 mg/dL   Creatinine, Ser 3.89 (H) 0.44 - 1.00 mg/dL   Calcium 8.5 (L) 8.9 - 10.3 mg/dL   GFR calc non Af Amer 47 (L) >60 mL/min   GFR calc Af Amer 55 (L) >60 mL/min   Anion gap 13 5 - 15    Comment: Performed at [facility], [address] [place], [place] [address]  Hemoglobin A1c     Status: Abnormal   Collection Time: 10/11/18  2:11 AM  Result Value Ref Range   Hgb A1c MFr Bld 8.8 (H) 4.8 - 5.6 %    Comment: (NOTE) Pre diabetes:          5.7%-6.4% Diabetes:              >6.4% Glycemic control for   <7.0% adults with diabetes    Mean Plasma  Glucose 205.86 mg/dL    Comment: Performed at [facility], [address] [place], [place] [address]  CBC     Status: None   Collection Time: 10/11/18  2:11 AM  Result Value Ref Range   WBC 9.7 4.0 - 10.5 K/uL   RBC 4.59 3.87 - 5.11 MIL/uL   Hemoglobin 13.0 12.0 - 15.0 g/dL   HCT 60.442.0 54.036.0 - 98.146.0 %   MCV 91.5 80.0 - 100.0 fL   MCH 28.3 26.0 - 34.0 pg   MCHC 31.0 30.0 - 36.0 g/dL   RDW 19.113.5 47.811.5 - 29.515.5 %   Platelets 178 150 - 400 K/uL   nRBC 0.0 0.0 - 0.2 %    Comment: Performed at [facility], [address] [place], [place] [address]  Basic metabolic panel     Status: Abnormal   Collection Time: 10/11/18  2:11 AM  Result Value Ref Range   Sodium 135 135 - 145 mmol/L   Potassium 3.5 3.5 - 5.1 mmol/L   Chloride 102 98 - 111 mmol/L   CO2 21 (L) 22 - 32 mmol/L   Glucose, Bld 241 (H) 70 - 99 mg/dL   BUN 31 (H) 8 - 23 mg/dL   Creatinine, Ser 8.651.08 (H) 0.44 - 1.00 mg/dL   Calcium 8.2 (L) 8.9 - 10.3 mg/dL   GFR calc non Af Amer 48 (L) >60 mL/min   GFR calc Af Amer 56 (L) >60 mL/min   Anion gap 12 5 - 15    Comment: Performed at [facility], [address]  [place], [place] [address]  Magnesium     Status: Abnormal   Collection Time: 10/11/18  2:11 AM  Result Value Ref Range   Magnesium 2.5 (H) 1.7 - 2.4 mg/dL    Comment: Performed at [facility], [address] [place], [place] [address]  Influenza panel by PCR (type A & B)     Status: None   Collection Time: 10/11/18  6:00 AM  Result Value Ref Range   Influenza A By PCR NEGATIVE NEGATIVE   Influenza B By PCR NEGATIVE NEGATIVE    Comment: (NOTE) The Xpert Xpress Flu assay is intended as an aid in the diagnosis of  influenza and should not be used as a sole basis for treatment.  This  assay is FDA approved for nasopharyngeal swab specimens only. Nasal  washings and aspirates are unacceptable for Xpert Xpress Flu testing. Performed at [facility], [address] [place], [place] [address]   Glucose, capillary     Status: Abnormal   Collection Time: 10/11/18  7:30 AM  Result Value Ref Range   Glucose-Capillary 182 (H) 70 - 99 mg/dL    No results found.  Review of Systems  Constitutional: Negative.   Eyes: Negative.   Cardiovascular: Negative.   Gastrointestinal: Negative.   Musculoskeletal:       Right hip pain  Skin: Negative.   Neurological: Negative.   Psychiatric/Behavioral: Negative.    Blood pressure (!) 140/58, pulse 60, temperature 98.3 F (36.8 C), temperature source Oral, resp. rate 16, SpO2 97 %. Physical Exam  Constitutional: She appears well-developed.  Eyes: Conjunctivae are normal.  Neck: Neck supple.  Cardiovascular: Normal rate.  Respiratory: Effort normal.  GI: Soft.  Musculoskeletal:     Comments: Pain with motion of right hip.  No tenderness palpation about the distal thigh or knee.  No tenderness about the right leg, ankle  or foot.  She is able to dorsiflex and plantarflex the ankle.  Endorses sensation light touch in bilateral feet.  Feet are warm and well-perfused.  No evidence of bilateral upper extremity injury.   Neurological: She is alert.  Skin: Skin is warm.  Psychiatric: She has a normal mood and affect.    Assessment/Plan: We will proceed with operative intervention for her right hip fracture.  This is planned for this afternoon.  Please keep patient n.p.o.  She does have an elevated hemoglobin A1c which puts her at a risk of increased wound complications, infection and nonunion.  We also discussed the other perioperative risk which include death.  She understands these risks and wished to proceed with surgery.  I also discussed this with her family overnight her daughter Selena Batten.  She agreed to proceed with surgery.  Postoperatively we will have her work with physical therapy for disposition.  For now keep her n.p.o.  Terance Hart 10/11/2018, 7:40 AM

## 2018-10-11 NOTE — Anesthesia Preprocedure Evaluation (Addendum)
Anesthesia Evaluation  Patient identified by MRN, date of birth, ID band Patient awake    Reviewed: Allergy & Precautions, H&P , NPO status , Patient's Chart, lab work & pertinent test results, reviewed documented beta blocker date and time   History of Anesthesia Complications Negative for: history of anesthetic complications  Airway Mallampati: II  TM Distance: <3 FB Neck ROM: Full    Dental  (+) Edentulous Upper, Edentulous Lower   Pulmonary    breath sounds clear to auscultation       Cardiovascular hypertension, Pt. on home beta blockers  Rhythm:Regular Rate:Normal + Systolic murmurs    Neuro/Psych Dementia negative psych ROS   GI/Hepatic GERD  Medicated and Controlled,  Endo/Other  diabetes, Type 2  Renal/GU      Musculoskeletal   Abdominal   Peds  Hematology   Anesthesia Other Findings   Reproductive/Obstetrics                            Anesthesia Physical  Anesthesia Plan  ASA: III and emergent  Anesthesia Plan: General   Post-op Pain Management:    Induction: Intravenous  PONV Risk Score and Plan: 3 and Ondansetron, Dexamethasone, Midazolam and Treatment may vary due to age or medical condition  Airway Management Planned: Oral ETT  Additional Equipment:   Intra-op Plan:   Post-operative Plan:   Informed Consent: I have reviewed the patients History and Physical, chart, labs and discussed the procedure including the risks, benefits and alternatives for the proposed anesthesia with the patient or authorized representative who has indicated his/her understanding and acceptance.     Dental advisory given  Plan Discussed with: CRNA, Anesthesiologist and Surgeon  Anesthesia Plan Comments:        Anesthesia Quick Evaluation

## 2018-10-11 NOTE — Progress Notes (Signed)
Initial Nutrition Assessment  RD working remotely.   DOCUMENTATION CODES:   Not applicable  INTERVENTION:  - diet advancement as medically feasible. - will assess for malnutrition at follow-up.   NUTRITION DIAGNOSIS:   Increased nutrient needs related to post-op healing as evidenced by estimated needs.  GOAL:   Patient will meet greater than or equal to 90% of their needs  MONITOR:   Diet advancement, Labs, Weight trends  REASON FOR ASSESSMENT:   Consult Assessment of nutrition requirement/status  ASSESSMENT:   83 year old female with medical history of HTN, DM, mild dementia, dyslipidemia, GERD, and iron deficiency anemia. She was sent to [facility] from [facility] on 10/1 due to R hip fracture. Patient reported having a mechanical fall on 9/29 when she attempted to go outside.  Patient has not been weighed since 08/05/2013 at which time she weighed 175 lb. Placed order early this AM for patient to be weighed but this has not yet been done. Patient is currently out of the room to pre-op for planned surgery to R hip.    Labs reviewed; CBGs: 182 and 134 mg/dl today, BUN: 31 mg/dl, creatinine: 1.08 mg/dl, Ca: 8.2 mg/dl, Mg: 2.5 mg/dl, GFR: 56 ml/min. Medications reviewed; 1 tablet oscal-D BID, 400 units vitamin D3/day, 325 mg ferrous sulfate/day, sliding scale novolog, 75 mcg oral synthroid/day, 1 g IV Mg sulfate x1 run 10/2, 60 mEq Klor-Con/day.     NUTRITION - FOCUSED PHYSICAL EXAM:  unable to complete at this time.   Diet Order:   Diet Order            Diet NPO time specified  Diet effective now              EDUCATION NEEDS:   No education needs have been identified at this time  Skin:  Skin Assessment: Reviewed RN Assessment  Last BM:  9/30  Height:   Ht Readings from Last 1 Encounters:  08/04/13 5\' 7"  (1.702 m)    Weight:   Wt Readings from Last 1 Encounters:  08/05/13 79.8 kg    Ideal Body Weight:     BMI:  There is no height or  weight on file to calculate BMI.  Estimated Nutritional Needs:   Kcal:  1700-1900 kcal  Protein:  85-95 grams  Fluid:  >/= 1.8 L/day      Jarome Matin, MS, RD, LDN, [facility] Inpatient Clinical Dietitian Pager # [phone] After hours/weekend pager # [phone]

## 2018-10-11 NOTE — Progress Notes (Addendum)
Patient has attempted to get out of bed without assistance 4 times since 2030. Floor mats are in place, bed in lowest position and  patient near the nurses station. While trying to get out of the bed the first time, the patient pulled the IV out of her R forearm causing a hematoma which is now covered with gauze and tape. L IV still intact and flushed well.  RN notified physician of patient's attempts to leave the bed without assistance 4 times.   Orders received: 1:1 safety sitter in person.   RN called the [facility] about the 1:1 sitter. AC told the RN there are no sitters available for in person 1:1 Air cabin crew.   RN notified the physician that there are no in person sitters available and requested a Oncologist.   Orders received: 1:1 Air cabin crew, Oncologist.  RN called [employer] to set up the tele sitter at 2240. RN spoke with Ivin Booty. RN was told it will be 15 min before they can setup the tele sitting. RN will stay in the room until Tele sitter is set up.

## 2018-10-11 NOTE — Op Note (Addendum)
Isabella Morris female 83 y.o. 10/11/2018  PreOperative Diagnosis: Right intertrochanteric hip fracture  PostOperative Diagnosis: Same  PROCEDURE: Cephalo-medullary nail of right intertrochanteric hip fracture  SURGEON: Dub Mikes, MD  ASSISTANT: None  ANESTHESIA: General  FINDINGS: Right intertrochanteric hip fracture  IMPLANTS: [employer] affixes nail  INDICATIONS:83 y.o. female fell at home yesterday and had hip pain.  She tried to ambulate on it but was unable to do so.  She was brought to the emergency department where x-rays and CT scan revealed a nondisplaced intertrochanteric hip fracture.  Given the nature of her fracture and ambulatory status she was indicated for the above surgery.  We discussed the risk benefits alternatives of surgery with her family.  Risks involved include but were not limited to wound healing complications, infection, nonunion, malunion, need for further surgery, damage to surrounding structures.  We also discussed the perioperative and anesthetic risk which include death.  PROCEDURE:Patient was identified in the preoperative holding area and the right hip was marked by myself.  The consent was signed by myself and the patient.  This identified the right lower extremity as the appropriate operative extremity and the surgeries to be performed was operative treatment of right hip fracture. They  were taken to the operating room where general endotracheal anesthesia was induced without difficulty and then was moved supine on a Hana table.  The operative leg was placed into the traction boot.  The non-operative leg was well-padded and affixed to the right leg holder with coban.  The arm was crossed over the chest and well padded. 2 g of Ancef was given.  Surgical timeout was performed.  Using fluoroscopy appropriate AP and lateral x-rays of the right hip were obtained.  Then using traction through the Hana table was able to reduce the fracture  fragments in a closed fashion.  Acceptable reduction was confirmed on AP and lateral x-rays.  Then the right hip was prepped and draped in usual sterile fashion.  A shower curtain drape was placed.  We began the surgery by placing the guidepin percutaneously at the appropriate starting point at the tip of the greater trochanter.  The appropriate starting point was confirmed on AP and lateral radiograph.  Then a 3 cm incision was made about the site of the percutaneous placement of the pin and the opening reamer with a soft tissue guide was placed into the wound down to the tip of the greater trochanter and the bone was entered with the opening reamer down to the level of lesser trochanter.  Then an 11 mm nail was chosen.  This was placed without difficulty and appropriate positioning was confirmed on fluoroscopy.  Then using the blade insertion guide a second incision was made distally down the thigh to allow for placement of the cephalo-medullary screw.  A derotational wire was used.  The guidepin for the screw was placed up the femoral neck in the appropriate position center center in the femoral head.  Then the reamer was used to ream for the screw and the screw was placed without difficulty.  The length of the screw was a 110 mm.  Then the set screw was tightened.  Then the interlocking screw was placed through the guide.  Final films were taken.  The wounds were irrigated with normal saline and the incisions were closed in a layered fashion using 2-0 Vicryl and staples.  Mepilex dressings were used.  He was then transferred to the hospital bed and awakened from anesthesia without  difficulty.  Counts were correct at the end the case.  There were no complications.  POST OPERATIVE INSTRUCTIONS: Weightbearing as tolerated right lower extremity DVT prophylaxis per medical team. Patient to be discharged on 325 mg aspirin She will follow-up in 2 weeks for x-rays and wound check.   TOURNIQUET TIME: No  tourniquet was used  BLOOD LOSS:  200 mL         DRAINS: none         SPECIMEN: none       COMPLICATIONS:  * No complications entered in OR log *         Disposition: PACU - hemodynamically stable.         Condition: stable

## 2018-10-11 NOTE — Progress Notes (Addendum)
RN called [employer] to set up the tele sitter, it has been 20 min since the initial call. RN spoke with Ivin Booty from [place]. RN was told on the first call it would be 15 minutes until someone could take report for the patient.   RN was told that the person taking this patient is not available yet and has an hour to get to the [employer] office and they are unsure how long it will be until they can take report and set up Tele sitting for this patient. RN will stay in the room until [employer] is set up.

## 2018-10-11 NOTE — Progress Notes (Addendum)
PROGRESS NOTE    Isabella Morris  ZOX:[id number] DOB: Nov 30, 1935 DOA: 10/10/2018 PCP: Toma DeitersHasanaj, Xaje A, MD  Brief Narrative: Is an 83 year old female history of hypertension diabetes mellitus, mild dementia, dyslipidemia, GERD, iron deficiency anemia [facility] right hip fracture. -Patient reports this was a mechanical fall 2 days ago when she was trying to go outside, no loss of consciousness reported. -CT in [facility] rocking him showed nondisplaced intertrochanteric right hip fracture, orthopedics was consulted  Assessment & Plan:    Closed right hip fracture -nondisplaced intratrochanteric fracture:   As evidenced by CT scan.  Orthopedic surgeon, Dr. Susa SimmondsAdair was consulted.  -Plan for surgery this afternoon -Patient does have an AS murmur however reports that she has pretty good functional status, denies any dyspnea with climbing a flight of stairs, walking approximately 1 block etc.  -Caution with excess fluid administration and avoiding hypotension -Will check 2D echocardiogram but do not anticipate need for delaying surgery -DVT prophylaxis postop -Continue atenolol  AS murmur -She does not recall ever hearing about this from any provider in the past  -Good functional status and METS as noted above, check echo  HTN:  -Continue home medications: Amlodipine, atenolol -Hold HCTZ  Dementia:  -Continue donepezil -Hold risperidone due to mildly prolonged QTC  Diabetes mellitus without complication (HCC): -Oral hypoglycemics on hold, CBG stable, continue sliding scale insulin  Anemia:  -on iron supplement  Hypokalemia:  -Replaced  DVT ppx: SCD Code Status: Full code Family Communication: None at bed side.  Disposition Plan:  Anticipate discharge back to previous home environment Consults called:  Dr. Susa SimmondsAdair     Procedures:   Antimicrobials:    Subjective: -Reports feeling okay overall, has mild discomfort in her  right hip  Objective: Vitals:   10/10/18 2035 10/11/18 0447 10/11/18 0948  BP: 127/60 (!) 140/58 (!) 128/59  Pulse: 96 60 60  Resp: 18 16 16   Temp: 98.2 F (36.8 C) 98.3 F (36.8 C)   TempSrc: Oral Oral   SpO2: 97% 97% 97%    Intake/Output Summary (Last 24 hours) at 10/11/2018 1059 Last data filed at 10/11/2018 0536 Gross per 24 hour  Intake 250 ml  Output 300 ml  Net -50 ml   There were no vitals filed for this visit.  Examination:  General exam: Pleasant elder elderly female, laying in bed, awake alert oriented to self and place, partly to time Respiratory system: Clear anteriorly Cardiovascular system: S1-S2/regular rate rhythm, grade 3 x 6 ejection systolic murmur noted  gastrointestinal system: Abdomen is nondistended, soft and nontender.Normal bowel sounds heard. Central nervous system: Alert and orientedx2 Extremities: Right lower extremity shortened and externally rotated Skin: No rashes, lesions or ulcers Psychiatry: Mood and affect is appropriate   Data Reviewed:   CBC: Recent Labs  Lab 10/11/18 0211  WBC 9.7  HGB 13.0  HCT 42.0  MCV 91.5  PLT 178   Basic Metabolic Panel: Recent Labs  Lab 10/10/18 2348 10/11/18 0211  NA 136 135  K 3.0* 3.5  CL 101 102  CO2 22 21*  GLUCOSE 217* 241*  BUN 31* 31*  CREATININE 1.10* 1.08*  CALCIUM 8.5* 8.2*  MG  --  2.5*   GFR: CrCl cannot be calculated (Unknown ideal weight.). Liver Function Tests: No results for input(s): AST, ALT, ALKPHOS, BILITOT, PROT, ALBUMIN in the last 168 hours. No results for input(s): LIPASE, AMYLASE in the last 168 hours. No results for input(s): AMMONIA  in the last 168 hours. Coagulation Profile: Recent Labs  Lab 10/10/18 2131  INR 1.1   Cardiac Enzymes: No results for input(s): CKTOTAL, CKMB, CKMBINDEX, TROPONINI in the last 168 hours. BNP (last 3 results) No results for input(s): PROBNP in the last 8760 hours. HbA1C: Recent Labs    10/11/18 0211  HGBA1C 8.8*    CBG: Recent Labs  Lab 10/10/18 2317 10/11/18 0730  GLUCAP 161* 182*   Lipid Profile: No results for input(s): CHOL, HDL, LDLCALC, TRIG, CHOLHDL, LDLDIRECT in the last 72 hours. Thyroid Function Tests: No results for input(s): TSH, T4TOTAL, FREET4, T3FREE, THYROIDAB in the last 72 hours. Anemia Panel: No results for input(s): VITAMINB12, FOLATE, FERRITIN, TIBC, IRON, RETICCTPCT in the last 72 hours. Urine analysis: No results found for: COLORURINE, APPEARANCEUR, LABSPEC, PHURINE, GLUCOSEU, HGBUR, BILIRUBINUR, KETONESUR, PROTEINUR, UROBILINOGEN, NITRITE, LEUKOCYTESUR Sepsis Labs: @LABRCNTIP (procalcitonin:4,lacticidven:4)  ) Recent Results (from the past 240 hour(s))  SARS Coronavirus 2 [facility] order, Performed in [facility] hospital lab) Nasopharyngeal Nasopharyngeal Swab     Status: None   Collection Time: 10/10/18  9:24 PM   Specimen: Nasopharyngeal Swab  Result Value Ref Range Status   SARS Coronavirus 2 NEGATIVE NEGATIVE Final    Comment: (NOTE) If result is NEGATIVE SARS-CoV-2 target nucleic acids are NOT DETECTED. The SARS-CoV-2 RNA is generally detectable in upper and lower  respiratory specimens during the acute phase of infection. The lowest  concentration of SARS-CoV-2 viral copies this assay can detect is 250  copies / mL. A negative result does not preclude SARS-CoV-2 infection  and should not be used as the sole basis for treatment or other  patient management decisions.  A negative result may occur with  improper specimen collection / handling, submission of specimen other  than nasopharyngeal swab, presence of viral mutation(s) within the  areas targeted by this assay, and inadequate number of viral copies  (<250 copies / mL). A negative result must be combined with clinical  observations, patient history, and epidemiological information. If result is POSITIVE SARS-CoV-2 target nucleic acids are DETECTED. The SARS-CoV-2 RNA is generally detectable in upper  and lower  respiratory specimens dur ing the acute phase of infection.  Positive  results are indicative of active infection with SARS-CoV-2.  Clinical  correlation with patient history and other diagnostic information is  necessary to determine patient infection status.  Positive results do  not rule out bacterial infection or co-infection with other viruses. If result is PRESUMPTIVE POSTIVE SARS-CoV-2 nucleic acids MAY BE PRESENT.   A presumptive positive result was obtained on the submitted specimen  and confirmed on repeat testing.  While 2019 novel coronavirus  (SARS-CoV-2) nucleic acids may be present in the submitted sample  additional confirmatory testing may be necessary for epidemiological  and / or clinical management purposes  to differentiate between  SARS-CoV-2 and other Sarbecovirus currently known to infect humans.  If clinically indicated additional testing with an alternate test  methodology [phone]) is advised. The SARS-CoV-2 RNA is generally  detectable in upper and lower respiratory sp ecimens during the acute  phase of infection. The expected result is Negative. Fact Sheet for Patients:  StrictlyIdeas.no Fact Sheet for Healthcare Providers: BankingDealers.co.za This test is not yet approved or cleared by the [place] FDA and has been authorized for detection and/or diagnosis of SARS-CoV-2 by FDA under an Emergency Use Authorization (EUA).  This EUA will remain in effect (meaning this test can be used) for the duration of the COVID-19 declaration under Section  564(b)(1) of the Act, 21 U.S.C. section 360bbb-3(b)(1), unless the authorization is terminated or revoked sooner. Performed at [facility], [address], [place], [place] [address]          Radiology Studies: No results found.      Scheduled Meds: . amLODipine  10 mg Oral Daily  . atenolol  50 mg Oral Daily  .  atorvastatin  20 mg Oral q1800  . calcium-vitamin D  1 tablet Oral BID WC  . chlorhexidine  60 mL Topical Once  . cholecalciferol  400 Units Oral Daily  . donepezil  10 mg Oral Daily  . ferrous sulfate  325 mg Oral Q breakfast  . insulin aspart  0-5 Units Subcutaneous QHS  . insulin aspart  0-9 Units Subcutaneous TID WC  . levothyroxine  75 mcg Oral Daily  . povidone-iodine  2 application Topical Once   Continuous Infusions: .  ceFAZolin (ANCEF) IV       LOS: 1 day    Time spent:    Zannie Cove, MD [facility]  10/11/2018, 10:59 AM

## 2018-10-11 NOTE — Plan of Care (Signed)

## 2018-10-11 NOTE — Transfer of Care (Signed)
Immediate Anesthesia Transfer of Care Note  Patient: Isabella Morris  Procedure(s) Performed: INTRAMEDULLARY (IM) NAIL INTERTROCHANTRIC (Right Hip)  Patient Location: PACU  Anesthesia Type:General  Level of Consciousness: awake and patient cooperative  Airway & Oxygen Therapy: Patient Spontanous Breathing and Patient connected to face mask  Post-op Assessment: Report given to RN and Post -op Vital signs reviewed and stable  Post vital signs: Reviewed and stable  Last Vitals:  Vitals Value Taken Time  BP    Temp    Pulse 64 10/11/18 1603  Resp 20 10/11/18 1603  SpO2 96 % 10/11/18 1603  Vitals shown include unvalidated device data.  Last Pain:  Vitals:   10/11/18 1324  TempSrc: Oral  PainSc:          Complications: No apparent anesthesia complications

## 2018-10-12 ENCOUNTER — Inpatient Hospital Stay (HOSPITAL_COMMUNITY): Payer: Medicare Other

## 2018-10-12 LAB — GLUCOSE, CAPILLARY
Glucose-Capillary: 257 mg/dL — ABNORMAL HIGH (ref 70–99)
Glucose-Capillary: 275 mg/dL — ABNORMAL HIGH (ref 70–99)
Glucose-Capillary: 376 mg/dL — ABNORMAL HIGH (ref 70–99)
Glucose-Capillary: 369 mg/dL — ABNORMAL HIGH (ref 70–99)
Glucose-Capillary: 91 mg/dL (ref 70–99)

## 2018-10-12 LAB — BASIC METABOLIC PANEL WITH GFR
Anion gap: 16 — ABNORMAL HIGH (ref 5–15)
BUN: 46 mg/dL — ABNORMAL HIGH (ref 8–23)
CO2: 18 mmol/L — ABNORMAL LOW (ref 22–32)
Calcium: 8.4 mg/dL — ABNORMAL LOW (ref 8.9–10.3)
Chloride: 100 mmol/L (ref 98–111)
Creatinine, Ser: 1.54 mg/dL — ABNORMAL HIGH (ref 0.44–1.00)
GFR calc Af Amer: 36 mL/min — ABNORMAL LOW
GFR calc non Af Amer: 31 mL/min — ABNORMAL LOW
Glucose, Bld: 286 mg/dL — ABNORMAL HIGH (ref 70–99)
Potassium: 4.4 mmol/L (ref 3.5–5.1)
Sodium: 134 mmol/L — ABNORMAL LOW (ref 135–145)

## 2018-10-12 LAB — HEMOGLOBIN A1C
Hgb A1c MFr Bld: 8.6 % — ABNORMAL HIGH (ref 4.8–5.6)
Mean Plasma Glucose: 200.12 mg/dL

## 2018-10-12 LAB — CBC
HCT: 38.2 % (ref 36.0–46.0)
Hemoglobin: 11.8 g/dL — ABNORMAL LOW (ref 12.0–15.0)
MCH: 28.4 pg (ref 26.0–34.0)
MCHC: 30.9 g/dL (ref 30.0–36.0)
MCV: 92 fL (ref 80.0–100.0)
Platelets: 213 10*3/uL (ref 150–400)
RBC: 4.15 MIL/uL (ref 3.87–5.11)
RDW: 13.4 % (ref 11.5–15.5)
WBC: 10.8 10*3/uL — ABNORMAL HIGH (ref 4.0–10.5)
nRBC: 0 % (ref 0.0–0.2)

## 2018-10-12 MED ORDER — ENOXAPARIN SODIUM 30 MG/0.3ML ~~LOC~~ SOLN
30.0000 mg | SUBCUTANEOUS | Status: DC
  Administered 2018-10-12 – 2018-10-13 (×2): 30 mg via SUBCUTANEOUS
  Filled 2018-10-12 (×2): qty 0.3

## 2018-10-12 MED ORDER — ZOLPIDEM TARTRATE 5 MG PO TABS: 5.0000 mg | ORAL_TABLET | Freq: Every evening | ORAL | Status: DC | PRN

## 2018-10-12 MED ORDER — INSULIN GLARGINE 100 UNIT/ML ~~LOC~~ SOLN
15.0000 [IU] | Freq: Every day | SUBCUTANEOUS | Status: DC
  Administered 2018-10-12 – 2018-10-14 (×3): 15 [IU] via SUBCUTANEOUS
  Filled 2018-10-12 (×4): qty 0.15

## 2018-10-12 MED ORDER — SODIUM CHLORIDE 0.9 % IV SOLN
INTRAVENOUS | Status: AC
  Administered 2018-10-12: 17:00:00 via INTRAVENOUS

## 2018-10-12 MED ORDER — INSULIN ASPART 100 UNIT/ML ~~LOC~~ SOLN
5.0000 [IU] | Freq: Three times a day (TID) | SUBCUTANEOUS | Status: DC
  Administered 2018-10-12: 5 [IU] via SUBCUTANEOUS
  Administered 2018-10-13: 100 [IU] via SUBCUTANEOUS

## 2018-10-12 NOTE — Progress Notes (Signed)
Patient continues to attempt to get out of the bed without assistance. Continues to pull and her oxygen and IV dressing. RN put safety mitts on the patient's hands. Patient pulled the safety mitts off x2. RN called the patient's daughter Suanne Marker, the phone rang with no answer. RN then called the patient's daughter Maudie Mercury and updated her.   RN gave the patient laundry to fold. Patient content folding laundry.   Physician made aware.

## 2018-10-12 NOTE — Progress Notes (Signed)
RN called [employer]. Informed the on-call physician about the pop sound hear, no pain, good pulses bilaterally, transfer to/from [facility] fine, CN present for transfer. On-call physician will call back the On-coming RN, Jerilynn Birkenhead.   Off-going RN gave this information to the On-coming RN and  Told her the On-call physician would be calling her back.

## 2018-10-12 NOTE — Progress Notes (Signed)
Patient's Daughter arrived to the floor and is sitting with the patient. Patient has not attempted to leave the bed since her daughter arrived.

## 2018-10-12 NOTE — Progress Notes (Signed)
RN and NT got patient up to the bedside commode. RN heard a pop sound. Patient states that her joints make that sound sometimes. RN had patient sit back down. Patient not in any pain, has good dorsalis pedis pulse bilaterally. Patient tolerated the transfer to/from the [facility] well.   RN consulted CN about the patient's hip.   RN will notify Dr. Lucia Gaskins.

## 2018-10-12 NOTE — Plan of Care (Signed)
  Problem: Clinical Measurements: Goal: Will remain free from infection Outcome: Progressing Goal: Respiratory complications will improve Outcome: Progressing Goal: Cardiovascular complication will be avoided Outcome: Progressing   Problem: Elimination: Goal: Will not experience complications related to urinary retention Outcome: Progressing   Problem: Pain Managment: Goal: General experience of comfort will improve Outcome: Progressing   Problem: Safety: Goal: Ability to remain free from injury will improve Outcome: Progressing Note: 1:1 Safety Tele Sitter, floor mats in place, bed low to the floor, bed alarm on

## 2018-10-12 NOTE — Progress Notes (Signed)
RN called [employer] to set up 1:1 Tele Sitter. RN on hold for 10 min. Tye picked up the phone and set up the tele sitter, she will call RN when she has a visual of the patient.

## 2018-10-12 NOTE — Progress Notes (Signed)
    Patient doing well PO day 1 S/P R hip fx repair, pain is well controlled and family at bedside. Pt with some dementia. She reports resting well however upon getting up this morning she heard and felt a "pop" and has been less likely to move since then. Pt and family concerned "She did something." Pt otherwise doing well.   Physical Exam: BP 118/70 (BP Location: Right Arm)   Pulse 68   Temp 98.2 F (36.8 C) (Oral)   Resp 16   SpO2 98%    CBC Latest Ref Rng & Units 10/12/2018 10/11/2018 08/08/2013  WBC 4.0 - 10.5 K/uL 10.8(H) 9.7 8.4  Hemoglobin 12.0 - 15.0 g/dL 11.8(L) 13.0 10.6(L)  Hematocrit 36.0 - 46.0 % 38.2 42.0 31.6(L)  Platelets 150 - 400 K/uL 213 178 252   Radiographs: 3 views R hip/pelvis ordered and hip is appropriately positioned, hardware appropriate, no adverse new findings.  Dressing in place, CDI, pt winces with palpation and ROM of R hip region, no deformity of legs/feet otherwise. Pt comfortable in bed, distal compartments soft, SCD's in place   NVI  POD #1 s/p R hip fx nailing, doing well  - Radiographs benign  - up with PT/OT, encourage ambulation, WBAT RLE - Pain well controlled, continue current regimen  - ASA 325 at D/C x2 wks  - Medicine team managing pt and we appreciate their input  -D/C per medicine, stable from ortho standpoint

## 2018-10-12 NOTE — Progress Notes (Addendum)
PROGRESS NOTE    Isabella Morris  [id number] DOB: Sep 26, 1935 DOA: 10/10/2018 PCP: Toma DeitersHasanaj, Xaje A, MD  Brief Narrative:83 year old female history of hypertension diabetes mellitus, mild dementia, dyslipidemia, GERD, iron deficiency anemia [facility] fracture. -Patient reports this was a mechanical fall 2 days ago when she was trying to go outside, no loss of consciousness reported. -CT in [facility] rocking [facility] showed nondisplaced intertrochanteric right hip fracture, orthopedics was consulted  Assessment & Plan:    Closed right hip fracture-nondisplaced intratrochanteric fracture:As evidenced byCT scan.  Orthopedic surgeon, Dr.Adairwas consulted.  -Underwent right hip intramedullary nail yesterday -Add Lovenox for DVT prophylaxis today -Orthopedics following, physical therapy to evaluate -Pain control -Discussed with patient and daughter they seem agreeable to short-term rehab  Mild AKI -From perioperative fluid shifts, blood loss, gentle fluids for 10 hours -Bmet in a.m.  Ejection systolic murmur -Suspect aortic stenosis, check 2D echocardiogram  HTN:  -Continue home medications:Amlodipine, atenolol -Hold HCTZ  Dementia:  -Continue donepezil -Hold risperidone due to mildly prolonged QTC  Diabetes mellitus without complication (HCC): -Oral hypoglycemics on hold, CBGs uncontrolled, add Lantus, check hemoglobin A1c  Anemia:  -oniron supplement  Hypokalemia:  -Replaced  DVT ppx: SCD Code Status:Full code Family Communication: Discussed with daughter at bedside Disposition Plan: Will need SNF for rehab  Consultants:   Oncology    Procedures:   Antimicrobials:    Subjective: -Feels okay, mild discomfort at the right hip, surgical site otherwise feels well  Objective: Vitals:   10/12/18 0020 10/12/18 0149 10/12/18 0602 10/12/18 0922  BP: (!) 110/57  118/70 (!) 111/54  Pulse: 68 70  68 67  Resp: 16  16 16   Temp: 98.5 F (36.9 C)  98.2 F (36.8 C) 98.4 F (36.9 C)  TempSrc: Axillary  Oral Oral  SpO2: 91% 97% 98% 92%    Intake/Output Summary (Last 24 hours) at 10/12/2018 1100 Last data filed at 10/12/2018 1000 Gross per 24 hour  Intake 1563.76 ml  Output 600 ml  Net 963.76 ml   There were no vitals filed for this visit.  Examination:  Gen: Elderly female, sitting up in bed, awake alert oriented to self and place HEENT: PERRLA, Neck supple, no JVD Lungs: Clear anteriorly, few basilar rales  CVS: S1-S2, regular rate rhythm, 3 x 6 systolic ejection murmur noted Abd: soft, Non tender, non distended, BS present Extremities: No edema, right hip, upper thigh with surgical incision, dressing Skin: no new rashes Psychiatry:  Mood & affect appropriate.     Data Reviewed:   CBC: Recent Labs  Lab 10/11/18 0211 10/12/18 0747  WBC 9.7 10.8*  HGB 13.0 11.8*  HCT 42.0 38.2  MCV 91.5 92.0  PLT 178 213   Basic Metabolic Panel: Recent Labs  Lab 10/10/18 2348 10/11/18 0211 10/12/18 0747  NA 136 135 134*  K 3.0* 3.5 4.4  CL 101 102 100  CO2 22 21* 18*  GLUCOSE 217* 241* 286*  BUN 31* 31* 46*  CREATININE 1.10* 1.08* 1.54*  CALCIUM 8.5* 8.2* 8.4*  MG  --  2.5*  --    GFR: CrCl cannot be calculated (Unknown ideal weight.). Liver Function Tests: No results for input(s): AST, ALT, ALKPHOS, BILITOT, PROT, ALBUMIN in the last 168 hours. No results for input(s): LIPASE, AMYLASE in the last 168 hours. No results for input(s): AMMONIA in the last 168 hours. Coagulation Profile: Recent Labs  Lab 10/10/18 2131  INR 1.1  Cardiac Enzymes: No results for input(s): CKTOTAL, CKMB, CKMBINDEX, TROPONINI in the last 168 hours. BNP (last 3 results) No results for input(s): PROBNP in the last 8760 hours. HbA1C: Recent Labs    10/11/18 0211  HGBA1C 8.8*   CBG: Recent Labs  Lab 10/11/18 1624 10/11/18 1732 10/11/18 2111 10/12/18 0624 10/12/18 0738   GLUCAP 159* 177* 182* 257* 275*   Lipid Profile: No results for input(s): CHOL, HDL, LDLCALC, TRIG, CHOLHDL, LDLDIRECT in the last 72 hours. Thyroid Function Tests: No results for input(s): TSH, T4TOTAL, FREET4, T3FREE, THYROIDAB in the last 72 hours. Anemia Panel: No results for input(s): VITAMINB12, FOLATE, FERRITIN, TIBC, IRON, RETICCTPCT in the last 72 hours. Urine analysis: No results found for: COLORURINE, APPEARANCEUR, LABSPEC, PHURINE, GLUCOSEU, HGBUR, BILIRUBINUR, KETONESUR, PROTEINUR, UROBILINOGEN, NITRITE, LEUKOCYTESUR Sepsis Labs: @LABRCNTIP (procalcitonin:4,lacticidven:4)  ) Recent Results (from the past 240 hour(s))  SARS Coronavirus 2 [facility] order, Performed in [facility] hospital lab) Nasopharyngeal Nasopharyngeal Swab     Status: None   Collection Time: 10/10/18  9:24 PM   Specimen: Nasopharyngeal Swab  Result Value Ref Range Status   SARS Coronavirus 2 NEGATIVE NEGATIVE Final    Comment: (NOTE) If result is NEGATIVE SARS-CoV-2 target nucleic acids are NOT DETECTED. The SARS-CoV-2 RNA is generally detectable in upper and lower  respiratory specimens during the acute phase of infection. The lowest  concentration of SARS-CoV-2 viral copies this assay can detect is 250  copies / mL. A negative result does not preclude SARS-CoV-2 infection  and should not be used as the sole basis for treatment or other  patient management decisions.  A negative result may occur with  improper specimen collection / handling, submission of specimen other  than nasopharyngeal swab, presence of viral mutation(s) within the  areas targeted by this assay, and inadequate number of viral copies  (<250 copies / mL). A negative result must be combined with clinical  observations, patient history, and epidemiological information. If result is POSITIVE SARS-CoV-2 target nucleic acids are DETECTED. The SARS-CoV-2 RNA is generally detectable in upper and lower  respiratory specimens dur ing  the acute phase of infection.  Positive  results are indicative of active infection with SARS-CoV-2.  Clinical  correlation with patient history and other diagnostic information is  necessary to determine patient infection status.  Positive results do  not rule out bacterial infection or co-infection with other viruses. If result is PRESUMPTIVE POSTIVE SARS-CoV-2 nucleic acids MAY BE PRESENT.   A presumptive positive result was obtained on the submitted specimen  and confirmed on repeat testing.  While 2019 novel coronavirus  (SARS-CoV-2) nucleic acids may be present in the submitted sample  additional confirmatory testing may be necessary for epidemiological  and / or clinical management purposes  to differentiate between  SARS-CoV-2 and other Sarbecovirus currently known to infect humans.  If clinically indicated additional testing with an alternate test  methodology [phone]) is advised. The SARS-CoV-2 RNA is generally  detectable in upper and lower respiratory sp ecimens during the acute  phase of infection. The expected result is Negative. Fact Sheet for Patients:  StrictlyIdeas.no Fact Sheet for Healthcare Providers: BankingDealers.co.za This test is not yet approved or cleared by the [place] FDA and has been authorized for detection and/or diagnosis of SARS-CoV-2 by FDA under an Emergency Use Authorization (EUA).  This EUA will remain in effect (meaning this test can be used) for the duration of the COVID-19 declaration under Section 564(b)(1) of the Act, 21 U.S.C. section 360bbb-3(b)(1), unless  the authorization is terminated or revoked sooner. Performed at [facility], [address], [place], [place] [address]   Surgical PCR screen     Status: Abnormal   Collection Time: 10/11/18  7:46 AM   Specimen: Nasal Mucosa; Nasal Swab  Result Value Ref Range Status   MRSA, PCR NEGATIVE NEGATIVE Final    Staphylococcus aureus POSITIVE (A) NEGATIVE Final    Comment: (NOTE) The Xpert SA Assay (FDA approved for NASAL specimens in patients 54 years of age and older), is one component of a comprehensive surveillance program. It is not intended to diagnose infection nor to guide or monitor treatment. Performed at [facility], [address], [place], [place] [address]          Radiology Studies: Dg C-arm 1-60 Min-no Report  Result Date: 10/11/2018 Fluoroscopy was utilized by the requesting physician.  No radiographic interpretation.   Dg Hip Unilat With Pelvis 2-3 Views Right  Result Date: 10/12/2018 CLINICAL DATA:  Popping sensation while doing physical therapy. EXAM: DG HIP (WITH OR WITHOUT PELVIS) 2-3V RIGHT COMPARISON:  10/11/2018 FINDINGS: The patient is status post placement of an intramedullary nail through the proximal right femur. There is subcutaneous gas and overlying soft tissue edema consistent with the recent surgical intervention. The alignment appears stable. Vascular calcifications are noted. IMPRESSION: Stable appearance of the orthopedic hardware. No new acute displaced fracture. Electronically Signed   By: Katherine Mantle M.D.   On: 10/12/2018 09:37   Dg Femur, Min 2 Views Right  Result Date: 10/11/2018 CLINICAL DATA:  Intertrochanteric right femur fracture ORIF. EXAM: RIGHT FEMUR 2 VIEWS COMPARISON:  Radiographs and CT 10/10/2018. FINDINGS: Seven spot fluoroscopic images are submitted. The nondisplaced intertrochanteric femur fracture is not well seen on the initial views. Subsequent views demonstrate the placement of a dynamic compression screw secured by a distal interlocking screw. The hardware is well positioned. No complications are identified. IMPRESSION: No demonstrated complications following ORIF of right femur intertrochanteric fracture. Electronically Signed   By: Carey Bullocks M.D.   On: 10/11/2018 16:07        Scheduled Meds: .  amLODipine  10 mg Oral Daily  . atenolol  50 mg Oral Daily  . atorvastatin  20 mg Oral q1800  . calcium-vitamin D  1 tablet Oral BID WC  . cholecalciferol  400 Units Oral Daily  . donepezil  10 mg Oral Daily  . ferrous sulfate  325 mg Oral Q breakfast  . insulin aspart  0-5 Units Subcutaneous QHS  . insulin aspart  0-9 Units Subcutaneous TID WC  . levothyroxine  75 mcg Oral Daily   Continuous Infusions: . sodium chloride Stopped (10/11/18 2134)  . sodium chloride 50 mL/hr at 10/12/18 1024  .  ceFAZolin (ANCEF) IV 1 g (10/12/18 0604)     LOS: 2 days    Time spent:    Zannie Cove, MD [facility]  10/12/2018, 11:00 AM

## 2018-10-12 NOTE — Evaluation (Signed)
Physical Therapy Evaluation Patient Details Name: Isabella Morris MRN: [id number] DOB: [birth year] Today's Date: 10/12/2018   History of Present Illness  83 year old female history of hypertension diabetes mellitus, mild dementia, dyslipidemia, GERD, iron deficiency anemia  fell at home , sustained nondisplaced intertrochanteric right hip fracture, S/P ORIF 10/11/18.  Clinical Impression  The patient is very pleasant and able to participate in mobility, to [facility] then to recliner. Daughter presnt and states consideration in going home w/ 24/7 vs SNF, expresses concern for either due to Covid risks. Pt admitted with above diagnosis.  Pt currently with functional limitations due to the deficits listed below (see PT Problem List). Pt will benefit from skilled PT to increase their independence and safety with mobility to allow discharge to the venue listed below.      Follow Up Recommendations SNF;Home health PT;Supervision/Assistance - 24 hour    Equipment Recommendations  None recommended by PT    Recommendations for Other Services       Precautions / Restrictions Precautions Precautions: Fall      Mobility  Bed Mobility Overal bed mobility: Needs Assistance Bed Mobility: Supine to Sit;Sit to Supine     Supine to sit: Mod assist;HOB elevated Sit to supine: Mod assist   General bed mobility comments: assist with right leg  Transfers Overall transfer level: Needs assistance Equipment used: Rolling walker (2 wheeled) Transfers: Sit to/from [facility] Sit to Stand: Mod assist Stand pivot transfers: Mod assist       General transfer comment: multimodal cues to push up/reach back for armrests. Stood with RW, small steps to [facility], noted antalgia  Ambulation/Gait Ambulation/Gait assistance: Mod assist Gait Distance (Feet): 5 Feet Assistive device: Rolling walker (2 wheeled) Gait Pattern/deviations: Step-to pattern;Decreased stance time - right;Antalgic     General  Gait Details: pt. bears at least 50% weight on right leg, improves with each step  Stairs            Wheelchair Mobility    Modified Rankin (Stroke Patients Only)       Balance Overall balance assessment: Needs assistance Sitting-balance support: Bilateral upper extremity supported;Feet unsupported Sitting balance-Leahy Scale: Fair     Standing balance support: During functional activity;Bilateral upper extremity supported Standing balance-Leahy Scale: Poor Standing balance comment: reliant on UE support                             Pertinent Vitals/Pain Pain Assessment: Faces Faces Pain Scale: Hurts little more Pain Location: right hip Pain Descriptors / Indicators: Discomfort Pain Intervention(s): Monitored during session    Home Living Family/patient expects to be discharged to:: Private residence Living Arrangements: Spouse/significant other Available Help at Discharge: Family Type of Home: House Home Access: Stairs to enter Entrance Stairs-Rails: Psychiatric nurse of Steps: 3 Home Layout: One level Home Equipment: Environmental consultant - 2 wheels Additional Comments: spouse is unable to physically assist the patient    Prior Function Level of Independence: Needs assistance   Gait / Transfers Assistance Needed: independent in home  ADL's / Homemaking Assistance Needed: has supervision        Hand Dominance        Extremity/Trunk Assessment   Upper Extremity Assessment Upper Extremity Assessment: Generalized weakness    Lower Extremity Assessment Lower Extremity Assessment: RLE deficits/detail RLE Deficits / Details: needs assistance to move the leg in bed, able to bear some weight standing    Cervical / Trunk Assessment Cervical /  Trunk Assessment: Kyphotic  Communication   Communication: No difficulties  Cognition Arousal/Alertness: Awake/alert Behavior During Therapy: WFL for tasks assessed/performed Overall Cognitive  Status: History of cognitive impairments - at baseline                                 General Comments: follws well, answers some questions appropriately      General Comments      Exercises     Assessment/Plan    PT Assessment Patient needs continued PT services  PT Problem List Decreased strength;Decreased mobility;Decreased safety awareness;Decreased range of motion;Decreased knowledge of precautions;Decreased activity tolerance;Decreased cognition;Decreased balance;Pain       PT Treatment Interventions DME instruction    PT Goals (Current goals can be found in the Care Plan section)  Acute Rehab PT Goals Patient Stated Goal: to walk and go home PT Goal Formulation: With patient/family Time For Goal Achievement: 10/26/18 Potential to Achieve Goals: Good    Frequency Min 3X/week(change freq if SNF for sure)   Barriers to discharge Decreased caregiver support per daughter, may hire 24/7 with concern for SNF due to covid    Co-evaluation               AM-PAC PT "6 Clicks" Mobility  Outcome Measure Help needed turning from your back to your side while in a flat bed without using bedrails?: A Lot Help needed moving from lying on your back to sitting on the side of a flat bed without using bedrails?: A Lot Help needed moving to and from a bed to a chair (including a wheelchair)?: A Lot Help needed standing up from a chair using your arms (e.g., wheelchair or bedside chair)?: A Lot Help needed to walk in hospital room?: A Lot Help needed climbing 3-5 steps with a railing? : Total 6 Click Score: 11    End of Session Equipment Utilized During Treatment: Gait belt Activity Tolerance: Patient tolerated treatment well Patient left: in chair;with call bell/phone within reach;with chair alarm set;with family/visitor present Nurse Communication: Mobility status PT Visit Diagnosis: Unsteadiness on feet (R26.81);Pain;Repeated falls (R29.6) Pain - Right/Left:  Right Pain - part of body: Hip    Time: 7829-5621 PT Time Calculation (min) (ACUTE ONLY): 28 min   Charges:   PT Evaluation $PT Eval Low Complexity: 1 Low PT Treatments $Therapeutic Activity: 8-22 mins        Blanchard Kelch PT Acute Rehabilitation Services Pager [phone] Office [phone]   Carlia, [place] 10/12/2018, 1:49 PM

## 2018-10-13 LAB — CBC
HCT: 35.3 % — ABNORMAL LOW (ref 36.0–46.0)
Hemoglobin: 11.4 g/dL — ABNORMAL LOW (ref 12.0–15.0)
MCH: 28.6 pg (ref 26.0–34.0)
MCHC: 32.3 g/dL (ref 30.0–36.0)
MCV: 88.7 fL (ref 80.0–100.0)
Platelets: 200 10*3/uL (ref 150–400)
RBC: 3.98 MIL/uL (ref 3.87–5.11)
RDW: 13.3 % (ref 11.5–15.5)
WBC: 10.1 10*3/uL (ref 4.0–10.5)
nRBC: 0 % (ref 0.0–0.2)

## 2018-10-13 LAB — GLUCOSE, CAPILLARY
Glucose-Capillary: 168 mg/dL — ABNORMAL HIGH (ref 70–99)
Glucose-Capillary: 108 mg/dL — ABNORMAL HIGH (ref 70–99)
Glucose-Capillary: 111 mg/dL — ABNORMAL HIGH (ref 70–99)
Glucose-Capillary: 201 mg/dL — ABNORMAL HIGH (ref 70–99)

## 2018-10-13 LAB — BASIC METABOLIC PANEL
Anion gap: 11 (ref 5–15)
BUN: 46 mg/dL — ABNORMAL HIGH (ref 8–23)
CO2: 20 mmol/L — ABNORMAL LOW (ref 22–32)
Calcium: 8.1 mg/dL — ABNORMAL LOW (ref 8.9–10.3)
Chloride: 103 mmol/L (ref 98–111)
Creatinine, Ser: 1.27 mg/dL — ABNORMAL HIGH (ref 0.44–1.00)
GFR calc Af Amer: 46 mL/min — ABNORMAL LOW (ref >60)
GFR calc non Af Amer: 40 mL/min — ABNORMAL LOW (ref >60)
Glucose, Bld: 128 mg/dL — ABNORMAL HIGH (ref 70–99)
Potassium: 3.4 mmol/L — ABNORMAL LOW (ref 3.5–5.1)
Sodium: 134 mmol/L — ABNORMAL LOW (ref 135–145)

## 2018-10-13 MED ORDER — POTASSIUM CHLORIDE CRYS ER 20 MEQ PO TBCR
40.0000 meq | EXTENDED_RELEASE_TABLET | Freq: Once | ORAL | Status: AC
  Administered 2018-10-13: 40 meq via ORAL
  Filled 2018-10-13: qty 2

## 2018-10-13 MED ORDER — INSULIN ASPART 100 UNIT/ML ~~LOC~~ SOLN
2.0000 [IU] | Freq: Three times a day (TID) | SUBCUTANEOUS | Status: DC
  Administered 2018-10-13 – 2018-10-14 (×3): 2 [IU] via SUBCUTANEOUS

## 2018-10-13 NOTE — TOC Initial Note (Signed)
Transition of Care [facility]) - Initial/Assessment Note    Patient Details  Name: Isabella Morris MRN: [id number] Date of Birth: 1935/02/13  [facility]) CM/SW Contact:    Coralyn Helling, LCSW Phone Number: 10/13/2018, 12:24 PM  Clinical Narrative:        Patient was quietly lying in bed. Assessment information gathered from Patients daughter Bjorn Loser. At times patient would nod and agree as Bjorn Loser provided information. Patient is from home with adult children and spouse. Bjorn Loser reposrts that patients spouse has dementia as well and she is concerned about home being the proper environment for rehab due to spouses condition. Bjorn Loser also expressed concerns about Covid in facilities and not being able to visit. Dtr would like for patient to be faxed out to facilities, however still exploring home health as an option.  Family would prefer a facility in [place] due to convenience, but are open to facilities in [place] as well. Bjorn Loser also discussed concerns about patients blood sugar spikes.       Expected Discharge Plan: Skilled Nursing Facility Barriers to Discharge: Continued Medical Work up   Patient Goals and CMS Choice Patient states their goals for this hospitalization and ongoing recovery are:: Daughter wants patient to get therapy to regain strength and return to baseline      Expected Discharge Plan and Services Expected Discharge Plan: Skilled Nursing Facility In-house Referral: NA Discharge Planning Services: NA Post Acute Care Choice: Skilled Nursing Facility Living arrangements for the past 2 months: Single Family Home                                      Prior Living Arrangements/Services Living arrangements for the past 2 months: Single Family Home Lives with:: Adult Children, Spouse          Need for Family Participation in Patient Care: Yes (Comment) Care giver support system in place?: Yes (comment)   Criminal Activity/Legal Involvement  Pertinent to Current Situation/Hospitalization: No - Comment as needed  Activities of Daily Living Home Assistive Devices/Equipment: Dentures (specify type), Shower chair with back, Grab bars in shower ADL Screening (condition at time of admission) Patient's cognitive ability adequate to safely complete daily activities?: Yes Is the patient deaf or have difficulty hearing?: No Does the patient have difficulty seeing, even when wearing glasses/contacts?: No Does the patient have difficulty concentrating, remembering, or making decisions?: No Patient able to express need for assistance with ADLs?: Yes Does the patient have difficulty dressing or bathing?: Yes Independently performs ADLs?: Yes (appropriate for developmental age) Does the patient have difficulty walking or climbing stairs?: Yes Weakness of Legs: Both Weakness of Arms/Hands: None  Permission Sought/Granted Permission sought to share information with : Family Supports Permission granted to share information with : Yes, Verbal Permission Granted  Share Information with NAME: Bjorn Loser  Permission granted to share info w AGENCY: SNF  Permission granted to share info w Relationship: Daughter     Emotional Assessment Appearance:: Appears stated age Attitude/Demeanor/Rapport: Unable to Assess Affect (typically observed): Calm Orientation: : Oriented to Self Alcohol / Substance Use: Not Applicable Psych Involvement: No (comment)  Admission diagnosis:  Rt Hip Fx Patient Active Problem List   Diagnosis Date Noted  . Dementia (HCC) 10/11/2018  . Fall 10/11/2018  . Closed right hip fracture (HCC) 10/10/2018  . Hypokalemia 10/10/2018  . Hypertension   . HLD (hyperlipidemia)   . GERD (  gastroesophageal reflux disease)   . Diabetes mellitus without complication ([place])   . Anemia   . Intertrochanteric fracture of left hip ([place]) 08/04/2013  . Hip fracture ([place]) 08/04/2013   PCP:  Neale Burly, MD Pharmacy:   [facility] [address], [place] [address] Phone: [phone] Fax: [phone]     Social Determinants of Health (SDOH) Interventions    Readmission Risk Interventions Readmission Risk Prevention Plan 10/13/2018  [place] or Home Care Consult Complete  Social Work Consult for [place] Planning/Counseling Complete  Palliative Care Screening Not Applicable  Some recent data might be hidden

## 2018-10-13 NOTE — Anesthesia Postprocedure Evaluation (Signed)
Anesthesia Post Note  Patient: Isabella Morris  Procedure(s) Performed: INTRAMEDULLARY (IM) NAIL INTERTROCHANTRIC (Right Hip)     Patient location during evaluation: PACU Anesthesia Type: General Level of consciousness: awake and patient cooperative Pain management: pain level controlled Vital Signs Assessment: post-procedure vital signs reviewed and stable Respiratory status: spontaneous breathing, nonlabored ventilation, respiratory function stable and patient connected to nasal cannula oxygen Cardiovascular status: blood pressure returned to baseline and stable Postop Assessment: no apparent nausea or vomiting Anesthetic complications: no    Last Vitals:  Vitals:   10/12/18 2149 10/13/18 0507  BP: (!) 122/58 126/62  Pulse: 64 65  Resp: 18 18  Temp: 37.1 C 36.9 C  SpO2: 95% 96%    Last Pain:  Vitals:   10/13/18 0746  TempSrc:   PainSc: 0-No pain                 Montrell Cessna

## 2018-10-13 NOTE — NC FL2 (Signed)
[place] LEVEL OF CARE SCREENING TOOL     IDENTIFICATION  Patient Name: Isabella Morris Birthdate: April 03, 1935 Sex: female Admission Date (Current Location): 10/10/2018  [facility] and [place] Number:  Engineer, manufacturing systems and Address:  [facility],  [place] [address] [place]      Provider Number: [id number]  Attending Physician Name and Address:  Domenic Polite, MD  Relative Name and Phone Number:       Current Level of Care: Hospital Recommended Level of Care: [place] Prior Approval Number:    Date Approved/Denied:   PASRR Number: [id number]  Discharge Plan: SNF    Current Diagnoses: Patient Active Problem List   Diagnosis Date Noted  . Dementia ([place]) 10/11/2018  . Fall 10/11/2018  . Closed right hip fracture ([place]) 10/10/2018  . Hypokalemia 10/10/2018  . Hypertension   . HLD (hyperlipidemia)   . GERD (gastroesophageal reflux disease)   . Diabetes mellitus without complication ([place])   . Anemia   . Intertrochanteric fracture of left hip ([place]) 08/04/2013  . Hip fracture ([place]) 08/04/2013    Orientation RESPIRATION BLADDER Height & Weight     Self  Normal Continent Weight: 136 lb (61.7 kg) Height:     BEHAVIORAL SYMPTOMS/MOOD NEUROLOGICAL BOWEL NUTRITION STATUS      Continent Diet(See dc summary)  AMBULATORY STATUS COMMUNICATION OF NEEDS Skin   Extensive Assist Verbally Surgical wounds(right hip)                       Personal Care Assistance Level of Assistance  Bathing, Dressing, Feeding Bathing Assistance: Maximum assistance Feeding assistance: Limited assistance Dressing Assistance: Maximum assistance     Functional Limitations Info  Sight, Hearing, Speech Sight Info: Adequate Hearing Info: Adequate Speech Info: Adequate    SPECIAL CARE FACTORS FREQUENCY  PT (By licensed PT), OT (By licensed OT)     PT Frequency: 5x/week OT Frequency: 5x/week            Contractures Contractures  Info: Not present    Additional Factors Info  Code Status, Allergies Code Status Info: Full Allergies Info: Codiene           Current Medications (10/13/2018):  This is the current hospital active medication list Current Facility-Administered Medications  Medication Dose Route Frequency Provider Last Rate Last Dose  . 0.9 %  sodium chloride infusion   Intravenous PRN Domenic Polite, MD   Stopped at 10/11/18 2134  . acetaminophen (TYLENOL) tablet 650 mg  650 mg Oral Q6H PRN Erle Crocker, MD   650 mg at 10/12/18 2129  . amLODipine (NORVASC) tablet 10 mg  10 mg Oral Daily Erle Crocker, MD   10 mg at 10/13/18 1010  . atenolol (TENORMIN) tablet 50 mg  50 mg Oral Daily Erle Crocker, MD   50 mg at 10/13/18 1009  . atorvastatin (LIPITOR) tablet 20 mg  20 mg Oral q1800 Erle Crocker, MD   20 mg at 10/12/18 1740  . calcium-vitamin D (OSCAL WITH D) 500-200 MG-UNIT per tablet 1 tablet  1 tablet Oral BID WC Erle Crocker, MD   1 tablet at 10/13/18 1009  . cholecalciferol (VITAMIN D3) tablet 400 Units  400 Units Oral Daily Erle Crocker, MD   400 Units at 10/13/18 1009  . donepezil (ARICEPT) tablet 10 mg  10 mg Oral Daily Erle Crocker, MD   10 mg at 10/12/18 2130  . enoxaparin (LOVENOX)  injection 30 mg  30 mg Subcutaneous Q24H Zannie Cove, MD   30 mg at 10/12/18 1322  . ferrous sulfate tablet 325 mg  325 mg Oral Q breakfast Terance Hart, MD   325 mg at 10/13/18 1010  . hydrALAZINE (APRESOLINE) tablet 25 mg  25 mg Oral TID PRN Terance Hart, MD      . insulin aspart (novoLOG) injection 0-5 Units  0-5 Units Subcutaneous QHS Terance Hart, MD      . insulin aspart (novoLOG) injection 0-9 Units  0-9 Units Subcutaneous TID WC Terance Hart, MD   2 Units at 10/13/18 [phone]  . insulin aspart (novoLOG) injection 2 Units  2 Units Subcutaneous TID WC Zannie Cove, MD      . insulin glargine (LANTUS) injection 15 Units  15  Units Subcutaneous Daily Zannie Cove, MD   15 Units at 10/13/18 1009  . levothyroxine (SYNTHROID) tablet 75 mcg  75 mcg Oral Daily Terance Hart, MD   75 mcg at 10/13/18 0558  . methocarbamol (ROBAXIN) tablet 500 mg  500 mg Oral Q8H PRN Terance Hart, MD   500 mg at 10/12/18 2130  . morphine 2 MG/ML injection 0.5 mg  0.5 mg Intravenous Q4H PRN Terance Hart, MD      . ondansetron [facility]) injection 4 mg  4 mg Intravenous Q8H PRN Terance Hart, MD   4 mg at 10/11/18 1516  . oxyCODONE-acetaminophen (PERCOCET/ROXICET) 5-325 MG per tablet 1 tablet  1 tablet Oral Q4H PRN Terance Hart, MD      . senna-docusate (Senokot-S) tablet 1 tablet  1 tablet Oral QHS PRN Terance Hart, MD      . zolpidem (AMBIEN) tablet 5 mg  5 mg Oral QHS PRN Zannie Cove, MD         Discharge Medications: Please see discharge summary for a list of discharge medications.  Relevant Imaging Results:  Relevant Lab Results:   Additional Information ssn-  Coralyn Helling, LCSW

## 2018-10-13 NOTE — Progress Notes (Signed)
PROGRESS NOTE    Isabella Morris  WGN:[id number] DOB: 1935/06/01 DOA: 10/10/2018 PCP: Toma DeitersHasanaj, Xaje A, MD  Brief Narrative:83 year old female history of hypertension diabetes mellitus, mild dementia, dyslipidemia, GERD, iron deficiency anemia [facility]. -Patient reports this was a mechanical fall 2 days ago when she was trying to go outside, no loss of consciousness reported. -[facility] showed nondisplaced intertrochanteric right hip fracture, orthopedics was consulted  Assessment & Plan:    Closed right hip fracture-nondisplaced intratrochanteric fracture: -  Orthopedic surgeon, Dr.Adairfollowing -Underwent right hip intramedullary nail 10/2 -Add Lovenox for DVT prophylaxis -Physical therapy following, pain control -Discharge planning, SNF for rehab  Mild AKI -From perioperative fluid shifts, blood loss,  -Improved with gentle hydration, stop fluids  Ejection systolic murmur -Suspect aortic stenosis, unfortunately my echo order got canceled will reorder  HTN:  -Continue home medications:Amlodipine, atenolol -Hold HCTZ  Dementia:  -Continue donepezil -Hold risperidone due to mildly prolonged QTC  Diabetes mellitus without complication (HCC): -Oral hypoglycemics on hold, HbA1c is 8.6, continue Lantus, NovoLog with meals, sliding scale, CBGs are much better today   Anemia:  -oniron supplement  Hypokalemia:  -Replaced  DVT ppx: SCD Code Status:Full code Family Communication: Called and discussed with daughter yesterday Disposition Plan: SNF for rehab tomorrow if stable  Consultants:   Oncology    Procedures:   Antimicrobials:    Subjective: -Feels better overall today, no events overnight, mild discomfort in her right hip  Objective: Vitals:   10/12/18 1156 10/12/18 1429 10/12/18 2149 10/13/18 0507  BP:  (!) 120/57 (!) 122/58 126/62  Pulse:  (!) 59 64 65   Resp:  16 18 18   Temp:  97.7 F (36.5 C) 98.7 F (37.1 C) 98.4 F (36.9 C)  TempSrc:  Oral Oral Oral  SpO2:  96% 95% 96%  Weight: 61.7 kg       Intake/Output Summary (Last 24 hours) at 10/13/2018 1127 Last data filed at 10/13/2018 0507 Gross per 24 hour  Intake 1332.78 ml  Output 1900 ml  Net -567.22 ml   Filed Weights   10/12/18 1156  Weight: 61.7 kg    Examination:  Gen: Early frail chronically ill female AAO x3 HEENT: PERRLA, Neck supple, no JVD Lungs: Clear anteriorly, few basilar CVS: S1-S2's, regular rhythm, grade 3 x 6 ejection systolic murmur Abd: soft, Non tender, non distended, BS present Extremities: No edema, right upper thigh with surgical incision, dressing Skin: no new rashes Psychiatry:  Mood & affect appropriate.     Data Reviewed:   CBC: Recent Labs  Lab 10/11/18 0211 10/12/18 0747 10/13/18 0240  WBC 9.7 10.8* 10.1  HGB 13.0 11.8* 11.4*  HCT 42.0 38.2 35.3*  MCV 91.5 92.0 88.7  PLT 178 213 200   Basic Metabolic Panel: Recent Labs  Lab 10/10/18 2348 10/11/18 0211 10/12/18 0747 10/13/18 0240  NA 136 135 134* 134*  K 3.0* 3.5 4.4 3.4*  CL 101 102 100 103  CO2 22 21* 18* 20*  GLUCOSE 217* 241* 286* 128*  BUN 31* 31* 46* 46*  CREATININE 1.10* 1.08* 1.54* 1.27*  CALCIUM 8.5* 8.2* 8.4* 8.1*  MG  --  2.5*  --   --    GFR: CrCl cannot be calculated (Unknown ideal weight.). Liver Function Tests: No results for input(s): AST, ALT, ALKPHOS, BILITOT, PROT, ALBUMIN in the last 168 hours. No results for input(s): LIPASE, AMYLASE in the last 168 hours. No  results for input(s): AMMONIA in the last 168 hours. Coagulation Profile: Recent Labs  Lab 10/10/18 2131  INR 1.1   Cardiac Enzymes: No results for input(s): CKTOTAL, CKMB, CKMBINDEX, TROPONINI in the last 168 hours. BNP (last 3 results) No results for input(s): PROBNP in the last 8760 hours. HbA1C: Recent Labs    10/11/18 0211 10/12/18 0743  HGBA1C 8.8* 8.6*   CBG: Recent  Labs  Lab 10/12/18 0738 10/12/18 1151 10/12/18 1639 10/12/18 2150 10/13/18 0733  GLUCAP 275* 376* 369* 91 168*   Lipid Profile: No results for input(s): CHOL, HDL, LDLCALC, TRIG, CHOLHDL, LDLDIRECT in the last 72 hours. Thyroid Function Tests: No results for input(s): TSH, T4TOTAL, FREET4, T3FREE, THYROIDAB in the last 72 hours. Anemia Panel: No results for input(s): VITAMINB12, FOLATE, FERRITIN, TIBC, IRON, RETICCTPCT in the last 72 hours. Urine analysis: No results found for: COLORURINE, APPEARANCEUR, LABSPEC, PHURINE, GLUCOSEU, HGBUR, BILIRUBINUR, KETONESUR, PROTEINUR, UROBILINOGEN, NITRITE, LEUKOCYTESUR Sepsis Labs: @LABRCNTIP (procalcitonin:4,lacticidven:4)  ) Recent Results (from the past 240 hour(s))  SARS Coronavirus 2 [facility] order, Performed in [facility] hospital lab) Nasopharyngeal Nasopharyngeal Swab     Status: None   Collection Time: 10/10/18  9:24 PM   Specimen: Nasopharyngeal Swab  Result Value Ref Range Status   SARS Coronavirus 2 NEGATIVE NEGATIVE Final    Comment: (NOTE) If result is NEGATIVE SARS-CoV-2 target nucleic acids are NOT DETECTED. The SARS-CoV-2 RNA is generally detectable in upper and lower  respiratory specimens during the acute phase of infection. The lowest  concentration of SARS-CoV-2 viral copies this assay can detect is 250  copies / mL. A negative result does not preclude SARS-CoV-2 infection  and should not be used as the sole basis for treatment or other  patient management decisions.  A negative result may occur with  improper specimen collection / handling, submission of specimen other  than nasopharyngeal swab, presence of viral mutation(s) within the  areas targeted by this assay, and inadequate number of viral copies  (<250 copies / mL). A negative result must be combined with clinical  observations, patient history, and epidemiological information. If result is POSITIVE SARS-CoV-2 target nucleic acids are DETECTED. The  SARS-CoV-2 RNA is generally detectable in upper and lower  respiratory specimens dur ing the acute phase of infection.  Positive  results are indicative of active infection with SARS-CoV-2.  Clinical  correlation with patient history and other diagnostic information is  necessary to determine patient infection status.  Positive results do  not rule out bacterial infection or co-infection with other viruses. If result is PRESUMPTIVE POSTIVE SARS-CoV-2 nucleic acids MAY BE PRESENT.   A presumptive positive result was obtained on the submitted specimen  and confirmed on repeat testing.  While 2019 novel coronavirus  (SARS-CoV-2) nucleic acids may be present in the submitted sample  additional confirmatory testing may be necessary for epidemiological  and / or clinical management purposes  to differentiate between  SARS-CoV-2 and other Sarbecovirus currently known to infect humans.  If clinically indicated additional testing with an alternate test  methodology [phone]) is advised. The SARS-CoV-2 RNA is generally  detectable in upper and lower respiratory sp ecimens during the acute  phase of infection. The expected result is Negative. Fact Sheet for Patients:  (XYV8592 Fact Sheet for Healthcare Providers: BoilerBrush.com.cy This test is not yet approved or cleared by the https://pope.com/ FDA and has been authorized for detection and/or diagnosis of SARS-CoV-2 by FDA under an Emergency Use Authorization (EUA).  This EUA will remain in  effect (meaning this test can be used) for the duration of the COVID-19 declaration under Section 564(b)(1) of the Act, 21 U.S.C. section 360bbb-3(b)(1), unless the authorization is terminated or revoked sooner. Performed at [facility], [place] [address] [place], [place] [address]   Surgical PCR screen     Status: Abnormal   Collection Time: 10/11/18  7:46 AM   Specimen: Nasal  Mucosa; Nasal Swab  Result Value Ref Range Status   MRSA, PCR NEGATIVE NEGATIVE Final   Staphylococcus aureus POSITIVE (A) NEGATIVE Final    Comment: (NOTE) The Xpert SA Assay (FDA approved for NASAL specimens in patients 54 years of age and older), is one component of a comprehensive surveillance program. It is not intended to diagnose infection nor to guide or monitor treatment. Performed at [facility], [place] [address] [place],  [address]          Radiology Studies: Dg C-arm 1-60 Min-no Report  Result Date: 10/11/2018 Fluoroscopy was utilized by the requesting physician.  No radiographic interpretation.   Dg Hip Unilat With Pelvis 2-3 Views Right  Result Date: 10/12/2018 CLINICAL DATA:  Popping sensation while doing physical therapy. EXAM: DG HIP (WITH OR WITHOUT PELVIS) 2-3V RIGHT COMPARISON:  10/11/2018 FINDINGS: The patient is status post placement of an intramedullary nail through the proximal right femur. There is subcutaneous gas and overlying soft tissue edema consistent with the recent surgical intervention. The alignment appears stable. Vascular calcifications are noted. IMPRESSION: Stable appearance of the orthopedic hardware. No new acute displaced fracture. Electronically Signed   By: Constance Holster M.D.   On: 10/12/2018 09:37   Dg Femur, Min 2 Views Right  Result Date: 10/11/2018 CLINICAL DATA:  Intertrochanteric right femur fracture ORIF. EXAM: RIGHT FEMUR 2 VIEWS COMPARISON:  Radiographs and CT 10/10/2018. FINDINGS: Seven spot fluoroscopic images are submitted. The nondisplaced intertrochanteric femur fracture is not well seen on the initial views. Subsequent views demonstrate the placement of a dynamic compression screw secured by a distal interlocking screw. The hardware is well positioned. No complications are identified. IMPRESSION: No demonstrated complications following ORIF of right femur intertrochanteric fracture. Electronically  Signed   By: Richardean Sale M.D.   On: 10/11/2018 16:07        Scheduled Meds: . amLODipine  10 mg Oral Daily  . atenolol  50 mg Oral Daily  . atorvastatin  20 mg Oral q1800  . calcium-vitamin D  1 tablet Oral BID WC  . cholecalciferol  400 Units Oral Daily  . donepezil  10 mg Oral Daily  . enoxaparin (LOVENOX) injection  30 mg Subcutaneous Q24H  . ferrous sulfate  325 mg Oral Q breakfast  . insulin aspart  0-5 Units Subcutaneous QHS  . insulin aspart  0-9 Units Subcutaneous TID WC  . insulin aspart  5 Units Subcutaneous TID WC  . insulin glargine  15 Units Subcutaneous Daily  . levothyroxine  75 mcg Oral Daily   Continuous Infusions: . sodium chloride Stopped (10/11/18 2134)     LOS: 3 days    Time spent: 58min    Domenic Polite, MD [facility]  10/13/2018, 11:27 AM

## 2018-10-13 NOTE — Evaluation (Signed)
Occupational Therapy Evaluation Patient Details Name: Isabella Morris MRN: [id number] DOB: Sep 30, 1935 Today's Date: 10/13/2018    History of Present Illness 83 year old female history of hypertension diabetes mellitus, mild dementia, dyslipidemia, GERD, iron deficiency anemia  fell at home , sustained nondisplaced intertrochanteric right hip fracture, S/P ORIF 10/11/18.   Clinical Impression   Patient is s/p R ORIF surgery resulting in functional limitations due to the deficits listed below (see OT problem list). Pt currently mod (A) for basic transfer and max (A) from standard toilet surface.  Patient will benefit from skilled OT acutely to increase independence and safety with ADLS to allow discharge SNF. Family will have to be able to manage max to mod (A) for basic transfer as of this session.      Follow Up Recommendations  SNF    Equipment Recommendations  3 in 1 bedside commode;Wheelchair (measurements OT);Wheelchair cushion (measurements OT);Other (comment)(RW)    Recommendations for Other Services       Precautions / Restrictions Precautions Precautions: Fall      Mobility Bed Mobility Overal bed mobility: Needs Assistance Bed Mobility: Supine to Sit     Supine to sit: Mod assist;HOB elevated Sit to supine: Max assist   General bed mobility comments: pt requires (A) max to bring BIL LE off eob and to return to supine.   Transfers Overall transfer level: Needs assistance Equipment used: Rolling walker (2 wheeled) Transfers: Sit to/from Stand Sit to Stand: Max assist         General transfer comment: from elevated surface mod (A) . pt once in standing reliant on RW    Balance           Standing balance support: Bilateral upper extremity supported;During functional activity Standing balance-Leahy Scale: Poor Standing balance comment: reliant on RW                           ADL either performed or assessed with clinical judgement   ADL  Overall ADL's : Needs assistance/impaired Eating/Feeding: Set up;Sitting   Grooming: Set up;Sitting   Upper Body Bathing: Moderate assistance;Sitting   Lower Body Bathing: Maximal assistance;Sit to/from stand   Upper Body Dressing : Moderate assistance;Sitting   Lower Body Dressing: Maximal assistance;Sit to/from stand   Toilet Transfer: Maximal assistance;Regular Toilet;Grab bars;RW Statistician Details (indicate cue type and reason): pt requires mod (A) to descend to surface but max to power up from toilet. pt needs max mod cues for sequence and L LE positioning Toileting- Clothing Manipulation and Hygiene: Min guard;Sitting/lateral lean Toileting - Clothing Manipulation Details (indicate cue type and reason): pt able to sit and complete in a seated position     Functional mobility during ADLs: Moderate assistance;Rolling walker General ADL Comments: pt expressed dizziness with transfer and immediately resolved return to supine. Pt requires mod cues for all sequence this session. in order to d/c home will need mod (A) for basic transfer from family at all times     Vision         Perception     Praxis      Pertinent Vitals/Pain Pain Assessment: Faces Faces Pain Scale: Hurts little more Pain Location: right hip Pain Descriptors / Indicators: Discomfort Pain Intervention(s): Monitored during session;Premedicated before session;Repositioned     Hand Dominance     Extremity/Trunk Assessment Upper Extremity Assessment Upper Extremity Assessment: Generalized weakness   Lower Extremity Assessment Lower Extremity Assessment: Defer to PT evaluation  Communication     Cognition Arousal/Alertness: Awake/alert Behavior During Therapy: WFL for tasks assessed/performed Overall Cognitive Status: History of cognitive impairments - at baseline                                 General Comments: unaware of R LE pain but able to verbalize location of  discomfort as her R leg   General Comments  ice applied to R LE    Exercises     Shoulder Instructions      Home Living                                          Prior Functioning/Environment                   OT Problem List:        OT Treatment/Interventions:      OT Goals(Current goals can be found in the care plan section) Acute Rehab OT Goals Patient Stated Goal: none stated at this time  OT Frequency: Min 2X/week   Barriers to D/C:            Co-evaluation              AM-PAC OT "6 Clicks" Daily Activity     Outcome Measure Help from another person eating meals?: A Little Help from another person taking care of personal grooming?: A Little Help from another person toileting, which includes using toliet, bedpan, or urinal?: A Lot Help from another person bathing (including washing, rinsing, drying)?: A Lot Help from another person to put on and taking off regular upper body clothing?: A Lot Help from another person to put on and taking off regular lower body clothing?: A Lot 6 Click Score: 14   End of Session Equipment Utilized During Treatment: Gait belt;Rolling walker Nurse Communication: Mobility status;Precautions  Activity Tolerance: Patient tolerated treatment well Patient left: in bed;with call bell/phone within reach;with bed alarm set  OT Visit Diagnosis: Unsteadiness on feet (R26.81);Muscle weakness (generalized) (M62.81)                Time: 1610-9604 OT Time Calculation (min): 23 min Charges:  OT General Charges $OT Visit: 1 Visit OT Evaluation $OT Eval Moderate Complexity: 1 Mod   Jeri Modena, OTR/L  Acute Rehabilitation Services Pager: [phone] Office: [phone] .   Jeri Modena 10/13/2018, 11:01 AM

## 2018-10-14 ENCOUNTER — Inpatient Hospital Stay (HOSPITAL_COMMUNITY): Payer: Medicare Other

## 2018-10-14 ENCOUNTER — Encounter (HOSPITAL_COMMUNITY): Payer: Self-pay | Admitting: Orthopaedic Surgery

## 2018-10-14 DIAGNOSIS — M7989 Other specified soft tissue disorders: Secondary | ICD-10-CM | POA: Diagnosis not present

## 2018-10-14 DIAGNOSIS — S72001D Fracture of unspecified part of neck of right femur, subsequent encounter for closed fracture with routine healing: Secondary | ICD-10-CM | POA: Diagnosis not present

## 2018-10-14 DIAGNOSIS — Z96641 Presence of right artificial hip joint: Secondary | ICD-10-CM | POA: Diagnosis not present

## 2018-10-14 DIAGNOSIS — M79604 Pain in right leg: Secondary | ICD-10-CM | POA: Diagnosis not present

## 2018-10-14 DIAGNOSIS — R4182 Altered mental status, unspecified: Secondary | ICD-10-CM | POA: Diagnosis not present

## 2018-10-14 DIAGNOSIS — I10 Essential (primary) hypertension: Secondary | ICD-10-CM | POA: Diagnosis not present

## 2018-10-14 DIAGNOSIS — M2550 Pain in unspecified joint: Secondary | ICD-10-CM | POA: Diagnosis not present

## 2018-10-14 DIAGNOSIS — Q253 Supravalvular aortic stenosis: Secondary | ICD-10-CM | POA: Diagnosis not present

## 2018-10-14 DIAGNOSIS — R2689 Other abnormalities of gait and mobility: Secondary | ICD-10-CM | POA: Diagnosis not present

## 2018-10-14 DIAGNOSIS — R410 Disorientation, unspecified: Secondary | ICD-10-CM | POA: Diagnosis not present

## 2018-10-14 DIAGNOSIS — I350 Nonrheumatic aortic (valve) stenosis: Secondary | ICD-10-CM

## 2018-10-14 DIAGNOSIS — M25551 Pain in right hip: Secondary | ICD-10-CM | POA: Diagnosis not present

## 2018-10-14 DIAGNOSIS — Z7401 Bed confinement status: Secondary | ICD-10-CM | POA: Diagnosis not present

## 2018-10-14 DIAGNOSIS — F0391 Unspecified dementia with behavioral disturbance: Secondary | ICD-10-CM | POA: Diagnosis not present

## 2018-10-14 DIAGNOSIS — W19XXXA Unspecified fall, initial encounter: Secondary | ICD-10-CM | POA: Diagnosis not present

## 2018-10-14 DIAGNOSIS — E119 Type 2 diabetes mellitus without complications: Secondary | ICD-10-CM | POA: Diagnosis not present

## 2018-10-14 DIAGNOSIS — R41841 Cognitive communication deficit: Secondary | ICD-10-CM | POA: Diagnosis not present

## 2018-10-14 DIAGNOSIS — M6281 Muscle weakness (generalized): Secondary | ICD-10-CM | POA: Diagnosis not present

## 2018-10-14 DIAGNOSIS — Z9181 History of falling: Secondary | ICD-10-CM | POA: Diagnosis not present

## 2018-10-14 DIAGNOSIS — F0280 Dementia in other diseases classified elsewhere without behavioral disturbance: Secondary | ICD-10-CM | POA: Diagnosis not present

## 2018-10-14 DIAGNOSIS — S72144D Nondisplaced intertrochanteric fracture of right femur, subsequent encounter for closed fracture with routine healing: Secondary | ICD-10-CM | POA: Diagnosis not present

## 2018-10-14 DIAGNOSIS — F0390 Unspecified dementia without behavioral disturbance: Secondary | ICD-10-CM | POA: Diagnosis not present

## 2018-10-14 DIAGNOSIS — M25552 Pain in left hip: Secondary | ICD-10-CM | POA: Diagnosis not present

## 2018-10-14 LAB — CBC
HCT: 34.3 % — ABNORMAL LOW (ref 36.0–46.0)
Hemoglobin: 10.8 g/dL — ABNORMAL LOW (ref 12.0–15.0)
MCH: 28.5 pg (ref 26.0–34.0)
MCHC: 31.5 g/dL (ref 30.0–36.0)
MCV: 90.5 fL (ref 80.0–100.0)
Platelets: 274 10*3/uL (ref 150–400)
RBC: 3.79 MIL/uL — ABNORMAL LOW (ref 3.87–5.11)
RDW: 13.6 % (ref 11.5–15.5)
WBC: 7.9 10*3/uL (ref 4.0–10.5)
nRBC: 0 % (ref 0.0–0.2)

## 2018-10-14 LAB — BASIC METABOLIC PANEL
Anion gap: 7 (ref 5–15)
Anion gap: 10 (ref 5–15)
BUN: 37 mg/dL — ABNORMAL HIGH (ref 8–23)
BUN: 33 mg/dL — ABNORMAL HIGH (ref 8–23)
CO2: 24 mmol/L (ref 22–32)
CO2: 25 mmol/L (ref 22–32)
Calcium: 8.2 mg/dL — ABNORMAL LOW (ref 8.9–10.3)
Calcium: 8.6 mg/dL — ABNORMAL LOW (ref 8.9–10.3)
Chloride: 104 mmol/L (ref 98–111)
Chloride: 100 mmol/L (ref 98–111)
Creatinine, Ser: 1.19 mg/dL — ABNORMAL HIGH (ref 0.44–1.00)
Creatinine, Ser: 1.09 mg/dL — ABNORMAL HIGH (ref 0.44–1.00)
GFR calc Af Amer: 50 mL/min — ABNORMAL LOW (ref >60)
GFR calc Af Amer: 55 mL/min — ABNORMAL LOW (ref >60)
GFR calc non Af Amer: 43 mL/min — ABNORMAL LOW (ref >60)
GFR calc non Af Amer: 48 mL/min — ABNORMAL LOW (ref >60)
Glucose, Bld: 143 mg/dL — ABNORMAL HIGH (ref 70–99)
Glucose, Bld: 191 mg/dL — ABNORMAL HIGH (ref 70–99)
Potassium: 5.4 mmol/L — ABNORMAL HIGH (ref 3.5–5.1)
Potassium: 3.6 mmol/L (ref 3.5–5.1)
Sodium: 135 mmol/L (ref 135–145)
Sodium: 135 mmol/L (ref 135–145)

## 2018-10-14 LAB — ECHOCARDIOGRAM COMPLETE: Weight: 2176 oz

## 2018-10-14 LAB — GLUCOSE, CAPILLARY
Glucose-Capillary: 158 mg/dL — ABNORMAL HIGH (ref 70–99)
Glucose-Capillary: 272 mg/dL — ABNORMAL HIGH (ref 70–99)

## 2018-10-14 LAB — SARS CORONAVIRUS 2 (TAT 6-24 HRS): SARS Coronavirus 2: NEGATIVE

## 2018-10-14 MED ORDER — OXYCODONE-ACETAMINOPHEN 5-325 MG PO TABS: 1.0000 | ORAL_TABLET | Freq: Four times a day (QID) | ORAL | 0 refills | Status: AC | PRN

## 2018-10-14 MED ORDER — ENOXAPARIN SODIUM 30 MG/0.3ML ~~LOC~~ SOLN: 30.0000 mg | SUBCUTANEOUS | Status: DC

## 2018-10-14 MED ORDER — INSULIN GLARGINE 100 UNIT/ML ~~LOC~~ SOLN: 15.0000 [IU] | Freq: Every day | SUBCUTANEOUS | Status: AC

## 2018-10-14 MED ORDER — POTASSIUM CHLORIDE CRYS ER 20 MEQ PO TBCR: 20.0000 meq | EXTENDED_RELEASE_TABLET | Freq: Every day | ORAL | 0 refills | Status: AC

## 2018-10-14 MED ORDER — ASPIRIN 325 MG PO TABS
325.0000 mg | ORAL_TABLET | Freq: Two times a day (BID) | ORAL | Status: DC
  Administered 2018-10-14: 325 mg via ORAL
  Filled 2018-10-14: qty 1

## 2018-10-14 MED ORDER — FERROUS SULFATE 325 (65 FE) MG PO TABS: 325.0000 mg | ORAL_TABLET | Freq: Two times a day (BID) | ORAL | 0 refills | Status: AC

## 2018-10-14 MED ORDER — ASPIRIN 325 MG PO TABS: 325.0000 mg | ORAL_TABLET | Freq: Two times a day (BID) | ORAL | Status: AC

## 2018-10-14 NOTE — Consult Note (Signed)
   [facility] CM Inpatient Consult   10/14/2018  Isabella Morris [birth year] [id number]  Patient screened for potential [facility] Care Management services due to unplanned readmission risk score of 22%, high.  Per chart review, current disposition plan is for SNF for rehab after surgery for right hip fracture.  No [facility] CM needs at this time.  Netta Cedars, MSN, [facility] Liaison Nurse Mobile Phone [phone]  Toll free office [phone]

## 2018-10-14 NOTE — Discharge Summary (Addendum)
Physician Discharge Summary  Isabella Morris YNW:[id number] DOB: 1935-05-27 DOA: 10/10/2018  PCP: Neale Burly, MD  Admit date: 10/10/2018 Discharge date: 10/14/2018  Time spent: 45 minutes  Recommendations for Outpatient Follow-up:  1. Please obtain nonurgent 2D echocardiogram to evaluate severity of AS 2. Orthopedics Dr. Melony Overly in 2 weeks 3. DVT prophylaxis with aspirin 325 mg twice daily recommended by Ortho, continue this for 4 weeks   Discharge Diagnoses:  Principal Problem:   Closed right hip fracture (HCC) Cardiac murmur   Hypertension   HLD (hyperlipidemia)   Diabetes mellitus without complication (HCC)   Anemia   Hypokalemia   Dementia ([place])   Fall   Discharge Condition: Improved  Diet recommendation: Diabetic, heart healthy  Filed Weights   10/12/18 1156  Weight: 61.7 kg    History of present illness:  83 year old female history of hypertension diabetes mellitus, mild dementia, dyslipidemia, GERD, iron deficiency anemia was sent to [facility] from [facility] ER with right hip fracture. -Patient reports this was a mechanical fall 2 days ago when she was trying to go outside, no loss of consciousness reported. -CT in [facility] showed nondisplaced intertrochanteric right hip fracture, orthopedics was consulted  Hospital Course:   Closed right hip fracture-nondisplaced intratrochanteric fracture: - Orthopedic surgeon, Dr.Adairwas consulted -Underwent right hip intramedullary nail 10/2 -Received Lovenox for DVT prophylaxis in the hospital, orthopedics recommended discharge on aspirin for DVT prophylaxis at discharge, continue this for 4 weeks -PT OT evaluation completed, SNF recommended for rehabilitation -Follow-up with orthopedics in 2 weeks, stop full dose aspirin twice daily in 4 weeks  Mild AKI -From perioperative fluid shifts, blood loss,  -Improved with gentle hydration  Ejection systolic murmur -Suspect aortic  stenosis,  echo that was ordered was unfortunately not done over the weekend, unfortunately this has still not been done yet, did not need to hold patient's discharge for echocardiogram at this time, recommend that this be done on a nonurgent basis as outpatient -Fortunately patient is not very symptomatic from this at this time  HTN:  -Continue home medications:Amlodipine, atenolol/HCTZ  Dementia:  -Her dementia is mild, she is awake alert oriented to self and place, has mild cognitive and memory deficits -Resumed home regimen of Risperdal and Aricept  Diabetes mellitus without complication ([place]): -Metformin was held in the hospital, hemoglobin A1c is 8.6, CBGs were uncontrolled requiring long-acting insulin, she will be discharged to rehab on Lantus 15 units, metformin will be restarted  Chronic iron deficiency anemia -Hemoglobin stable postop, iron resumed  Hypokalemia: -Replaced   Discharge Exam: Vitals:   10/13/18 2139 10/14/18 0619  BP: 120/66 (!) 123/57  Pulse: 66 60  Resp: 16 16  Temp: 98.4 F (36.9 C) 97.9 F (36.6 C)  SpO2: 96% 98%    General: Awake alert oriented to self and place only Cardiovascular: S1-S2, regular rate rhythm Respiratory: Clear bilaterally  Discharge Instructions    Allergies as of 10/14/2018      Reactions   Codeine Nausea And Vomiting      Medication List    STOP taking these medications   enoxaparin 40 MG/0.4ML injection Commonly known as: LOVENOX     TAKE these medications   amLODipine 10 MG tablet Commonly known as: NORVASC Take 10 mg by mouth daily.   aspirin 325 MG tablet Take 1 tablet (325 mg total) by mouth 2 (two) times daily.   atenolol-chlorthalidone 50-25 MG tablet Commonly known as: TENORETIC Take 1 tablet by mouth daily.  atorvastatin 20 MG tablet Commonly known as: LIPITOR Take 20 mg by mouth daily.   Caltrate 600+D 600-800 MG-UNIT Tabs Generic drug: Calcium Carb-Cholecalciferol Take 1 tablet  by mouth 2 (two) times daily with breakfast and lunch.   cholecalciferol 10 MCG (400 UNIT) Tabs tablet Commonly known as: VITAMIN D3 Take 400 Units by mouth daily.   donepezil 10 MG tablet Commonly known as: ARICEPT Take 10 mg by mouth daily.   ferrous sulfate 325 (65 FE) MG tablet Take 1 tablet (325 mg total) by mouth 2 (two) times daily with a meal. What changed: when to take this   insulin glargine 100 UNIT/ML injection Commonly known as: LANTUS Inject 0.15 mLs (15 Units total) into the skin daily.   levothyroxine 75 MCG tablet Commonly known as: SYNTHROID Take 75 mcg by mouth daily.   metFORMIN 500 MG tablet Commonly known as: GLUCOPHAGE Take 500 mg by mouth 2 (two) times daily.   oxyCODONE-acetaminophen 5-325 MG tablet Commonly known as: PERCOCET/ROXICET Take 1 tablet by mouth every 4 (four) hours as needed for moderate to severe pain   potassium chloride SA 20 MEQ tablet Commonly known as: KLOR-CON Take 1 tablet (20 mEq total) by mouth daily. What changed: how much to take   risperiDONE 0.5 MG tablet Commonly known as: RISPERDAL Take 0.5 mg by mouth at bedtime.   senna-docusate 8.6-50 MG tablet Commonly known as: Senokot-S Take 1 tablet by mouth 2 (two) times daily.      Allergies  Allergen Reactions  . Codeine Nausea And Vomiting   Follow-up Information    Terance Hart, MD. Schedule an appointment as soon as possible for a visit in 2 weeks.   Specialty: Orthopedic Surgery Contact information: [address] [place] [address] [phone]            The results of significant diagnostics from this hospitalization (including imaging, microbiology, ancillary and laboratory) are listed below for reference.    Significant Diagnostic Studies: Dg C-arm 1-60 Min-no Report  Result Date: 10/11/2018 Fluoroscopy was utilized by the requesting physician.  No radiographic interpretation.   Dg Hip Unilat With Pelvis 2-3 Views Right  Result  Date: 10/12/2018 CLINICAL DATA:  Popping sensation while doing physical therapy. EXAM: DG HIP (WITH OR WITHOUT PELVIS) 2-3V RIGHT COMPARISON:  10/11/2018 FINDINGS: The patient is status post placement of an intramedullary nail through the proximal right femur. There is subcutaneous gas and overlying soft tissue edema consistent with the recent surgical intervention. The alignment appears stable. Vascular calcifications are noted. IMPRESSION: Stable appearance of the orthopedic hardware. No new acute displaced fracture. Electronically Signed   By: Katherine Mantle M.D.   On: 10/12/2018 09:37   Dg Femur, Min 2 Views Right  Result Date: 10/11/2018 CLINICAL DATA:  Intertrochanteric right femur fracture ORIF. EXAM: RIGHT FEMUR 2 VIEWS COMPARISON:  Radiographs and CT 10/10/2018. FINDINGS: Seven spot fluoroscopic images are submitted. The nondisplaced intertrochanteric femur fracture is not well seen on the initial views. Subsequent views demonstrate the placement of a dynamic compression screw secured by a distal interlocking screw. The hardware is well positioned. No complications are identified. IMPRESSION: No demonstrated complications following ORIF of right femur intertrochanteric fracture. Electronically Signed   By: Carey Bullocks M.D.   On: 10/11/2018 16:07    Microbiology: Recent Results (from the past 240 hour(s))  SARS Coronavirus 2 [facility] order, Performed in [facility] hospital lab) Nasopharyngeal Nasopharyngeal Swab     Status: None   Collection Time: 10/10/18  9:24  PM   Specimen: Nasopharyngeal Swab  Result Value Ref Range Status   SARS Coronavirus 2 NEGATIVE NEGATIVE Final    Comment: (NOTE) If result is NEGATIVE SARS-CoV-2 target nucleic acids are NOT DETECTED. The SARS-CoV-2 RNA is generally detectable in upper and lower  respiratory specimens during the acute phase of infection. The lowest  concentration of SARS-CoV-2 viral copies this assay can detect is 250  copies / mL. A  negative result does not preclude SARS-CoV-2 infection  and should not be used as the sole basis for treatment or other  patient management decisions.  A negative result may occur with  improper specimen collection / handling, submission of specimen other  than nasopharyngeal swab, presence of viral mutation(s) within the  areas targeted by this assay, and inadequate number of viral copies  (<250 copies / mL). A negative result must be combined with clinical  observations, patient history, and epidemiological information. If result is POSITIVE SARS-CoV-2 target nucleic acids are DETECTED. The SARS-CoV-2 RNA is generally detectable in upper and lower  respiratory specimens dur ing the acute phase of infection.  Positive  results are indicative of active infection with SARS-CoV-2.  Clinical  correlation with patient history and other diagnostic information is  necessary to determine patient infection status.  Positive results do  not rule out bacterial infection or co-infection with other viruses. If result is PRESUMPTIVE POSTIVE SARS-CoV-2 nucleic acids MAY BE PRESENT.   A presumptive positive result was obtained on the submitted specimen  and confirmed on repeat testing.  While 2019 novel coronavirus  (SARS-CoV-2) nucleic acids may be present in the submitted sample  additional confirmatory testing may be necessary for epidemiological  and / or clinical management purposes  to differentiate between  SARS-CoV-2 and other Sarbecovirus currently known to infect humans.  If clinically indicated additional testing with an alternate test  methodology [phone]) is advised. The SARS-CoV-2 RNA is generally  detectable in upper and lower respiratory sp ecimens during the acute  phase of infection. The expected result is Negative. Fact Sheet for Patients:  BoilerBrush.com.cy Fact Sheet for Healthcare Providers: https://pope.com/ This test is not  yet approved or cleared by the [place] FDA and has been authorized for detection and/or diagnosis of SARS-CoV-2 by FDA under an Emergency Use Authorization (EUA).  This EUA will remain in effect (meaning this test can be used) for the duration of the COVID-19 declaration under Section 564(b)(1) of the Act, 21 U.S.C. section 360bbb-3(b)(1), unless the authorization is terminated or revoked sooner. Performed at [facility], [address] [place], [place] [address]   Surgical PCR screen     Status: Abnormal   Collection Time: 10/11/18  7:46 AM   Specimen: Nasal Mucosa; Nasal Swab  Result Value Ref Range Status   MRSA, PCR NEGATIVE NEGATIVE Final   Staphylococcus aureus POSITIVE (A) NEGATIVE Final    Comment: (NOTE) The Xpert SA Assay (FDA approved for NASAL specimens in patients 43 years of age and older), is one component of a comprehensive surveillance program. It is not intended to diagnose infection nor to guide or monitor treatment. Performed at [facility], [address] [place], [place] [address]   SARS CORONAVIRUS 2 (TAT 6-24 HRS) Nasopharyngeal Nasopharyngeal Swab     Status: None   Collection Time: 10/13/18  9:02 PM   Specimen: Nasopharyngeal Swab  Result Value Ref Range Status   SARS Coronavirus 2 NEGATIVE NEGATIVE Final    Comment: (NOTE) SARS-CoV-2 target nucleic  acids are NOT DETECTED. The SARS-CoV-2 RNA is generally detectable in upper and lower respiratory specimens during the acute phase of infection. Negative results do not preclude SARS-CoV-2 infection, do not rule out co-infections with other pathogens, and should not be used as the sole basis for treatment or other patient management decisions. Negative results must be combined with clinical observations, patient history, and epidemiological information. The expected result is Negative. Fact Sheet for Patients: HairSlick.nohttps://www.fda.gov/media/138098/download Fact  Sheet for Healthcare Providers: quierodirigir.comhttps://www.fda.gov/media/138095/download This test is not yet approved or cleared by the [place] FDA and  has been authorized for detection and/or diagnosis of SARS-CoV-2 by FDA under an Emergency Use Authorization (EUA). This EUA will remain  in effect (meaning this test can be used) for the duration of the COVID-19 declaration under Section 56 4(b)(1) of the Act, 21 U.S.C. section 360bbb-3(b)(1), unless the authorization is terminated or revoked sooner. Performed at [facility], [address] [place], [place] [address]      Labs: Basic Metabolic Panel: Recent Labs  Lab 10/11/18 0211 10/12/18 0747 10/13/18 0240 10/14/18 0315 10/14/18 0828  NA 135 134* 134* 135 135  K 3.5 4.4 3.4* 5.4* 3.6  CL 102 100 103 104 100  CO2 21* 18* 20* 24 25  GLUCOSE 241* 286* 128* 143* 191*  BUN 31* 46* 46* 37* 33*  CREATININE 1.08* 1.54* 1.27* 1.19* 1.09*  CALCIUM 8.2* 8.4* 8.1* 8.2* 8.6*  MG 2.5*  --   --   --   --    Liver Function Tests: No results for input(s): AST, ALT, ALKPHOS, BILITOT, PROT, ALBUMIN in the last 168 hours. No results for input(s): LIPASE, AMYLASE in the last 168 hours. No results for input(s): AMMONIA in the last 168 hours. CBC: Recent Labs  Lab 10/11/18 0211 10/12/18 0747 10/13/18 0240 10/14/18 0315  WBC 9.7 10.8* 10.1 7.9  HGB 13.0 11.8* 11.4* 10.8*  HCT 42.0 38.2 35.3* 34.3*  MCV 91.5 92.0 88.7 90.5  PLT 178 213 200 274   Cardiac Enzymes: No results for input(s): CKTOTAL, CKMB, CKMBINDEX, TROPONINI in the last 168 hours. BNP: BNP (last 3 results) Recent Labs    10/10/18 2131  BNP 216.2*    ProBNP (last 3 results) No results for input(s): PROBNP in the last 8760 hours.  CBG: Recent Labs  Lab 10/13/18 0733 10/13/18 1141 10/13/18 1640 10/13/18 2119 10/14/18 0729  GLUCAP 168* 108* 111* 201* 158*       Signed:  Zannie CovePreetha Renna Kilmer MD.  [facility] 10/14/2018, 12:17 PM

## 2018-10-14 NOTE — Progress Notes (Signed)
  Echocardiogram 2D Echocardiogram has been performed.  Riel Hirschman G Eoghan Belcher 10/14/2018, 4:13 PM

## 2018-10-14 NOTE — Progress Notes (Signed)
  Echocardiogram 2D Echocardiogram has been attempted. Patient just placed in chair. Asked to return at later time.  Isabella Morris 10/14/2018, 10:37 AM

## 2018-10-14 NOTE — Progress Notes (Signed)
Isabella Morris is a 83 y.o. female   Orthopaedic diagnosis: Right peritrochanteric hip fracture status post cephalo-medullary nail  Subjective: Patient sitting up in the chair.  She states her hip pain has improved significantly.  She is been able to work with physical therapy.  No complaints today.  She is awaiting SNF placement.  Objectyive: Vitals:   10/13/18 2139 10/14/18 0619  BP: 120/66 (!) 123/57  Pulse: 66 60  Resp: 16 16  Temp: 98.4 F (36.9 C) 97.9 F (36.6 C)  SpO2: 96% 98%     Exam: Awake and alert Respirations even and unlabored No acute distress  Right hip dressing in place.  Assessment: Status post cephalo-medullary nailing of right peritrochanteric hip fracture, doing well   Plan: She is awaiting SNF placement.  She also has an echocardiogram pending.  From an orthopedic standpoint she is safe for discharge to the SNF.  She will follow-up with me in 2 weeks for x-rays of the right hip and suture removal if appropriate. Weightbearing as tolerated right lower extremity.  She will be discharged on full strength aspirin for DVT prophylaxis.   Radene Journey, MD

## 2018-10-16 DIAGNOSIS — Z96641 Presence of right artificial hip joint: Secondary | ICD-10-CM | POA: Diagnosis not present

## 2018-10-16 DIAGNOSIS — E119 Type 2 diabetes mellitus without complications: Secondary | ICD-10-CM | POA: Diagnosis not present

## 2018-10-16 DIAGNOSIS — I10 Essential (primary) hypertension: Secondary | ICD-10-CM | POA: Diagnosis not present

## 2018-10-16 DIAGNOSIS — F0280 Dementia in other diseases classified elsewhere without behavioral disturbance: Secondary | ICD-10-CM | POA: Diagnosis not present

## 2018-10-22 DIAGNOSIS — Q253 Supravalvular aortic stenosis: Secondary | ICD-10-CM | POA: Diagnosis not present

## 2018-10-22 DIAGNOSIS — I350 Nonrheumatic aortic (valve) stenosis: Secondary | ICD-10-CM | POA: Diagnosis not present

## 2018-10-25 ENCOUNTER — Ambulatory Visit (HOSPITAL_COMMUNITY)
Admission: RE | Admit: 2018-10-25 | Discharge: 2018-10-25 | Disposition: A | Discharge status: Home / Self Care | Payer: No Typology Code available for payment source | Source: Ambulatory Visit | Attending: Internal Medicine | Admitting: Internal Medicine

## 2018-10-25 ENCOUNTER — Other Ambulatory Visit: Payer: Self-pay

## 2018-10-25 ENCOUNTER — Other Ambulatory Visit (HOSPITAL_COMMUNITY): Payer: Self-pay | Admitting: Orthopaedic Surgery

## 2018-10-25 DIAGNOSIS — M7989 Other specified soft tissue disorders: Secondary | ICD-10-CM | POA: Diagnosis not present

## 2018-10-25 DIAGNOSIS — M79604 Pain in right leg: Secondary | ICD-10-CM

## 2018-10-25 DIAGNOSIS — M25551 Pain in right hip: Secondary | ICD-10-CM | POA: Diagnosis not present

## 2018-10-25 NOTE — Progress Notes (Signed)
Right lower extremity venous duplex completed. Refer to "CV Proc" under chart review to view preliminary results.  10/25/2018 2:27 PM Maudry Mayhew, MHA, RVT, RDCS, RDMS

## 2018-11-01 ENCOUNTER — Other Ambulatory Visit: Payer: Self-pay | Admitting: *Deleted

## 2018-11-01 NOTE — Patient Outreach (Addendum)
Late entry for 10/31/18  Member assessed for potential [facility] Care Management needs as a benefit of  [place] [employer].  Member is currently receiving rehab therapy at [facility].  Member discussed in weekly telephonic IDT meeting with facility staff, [facility] team, and writer.  Disposition plan is for member to return home with husband and caregiver assistance. Likely dc home next week.  Will continue to follow for disposition plans, progression and for potential [facility] Care Management needs.    Marthenia Rolling, MSN-Ed, RN,BSN [facility] Acute Care Coordinator [phone] [facility]) [phone]  (Toll free office)

## 2018-11-17 DIAGNOSIS — E039 Hypothyroidism, unspecified: Secondary | ICD-10-CM | POA: Diagnosis not present

## 2018-11-17 DIAGNOSIS — K219 Gastro-esophageal reflux disease without esophagitis: Secondary | ICD-10-CM | POA: Diagnosis not present

## 2018-11-17 DIAGNOSIS — E119 Type 2 diabetes mellitus without complications: Secondary | ICD-10-CM | POA: Diagnosis not present

## 2018-11-17 DIAGNOSIS — Z9181 History of falling: Secondary | ICD-10-CM | POA: Diagnosis not present

## 2018-11-17 DIAGNOSIS — Z96641 Presence of right artificial hip joint: Secondary | ICD-10-CM | POA: Diagnosis not present

## 2018-11-17 DIAGNOSIS — R011 Cardiac murmur, unspecified: Secondary | ICD-10-CM | POA: Diagnosis not present

## 2018-11-17 DIAGNOSIS — S72001D Fracture of unspecified part of neck of right femur, subsequent encounter for closed fracture with routine healing: Secondary | ICD-10-CM | POA: Diagnosis not present

## 2018-11-17 DIAGNOSIS — E785 Hyperlipidemia, unspecified: Secondary | ICD-10-CM | POA: Diagnosis not present

## 2018-11-17 DIAGNOSIS — I10 Essential (primary) hypertension: Secondary | ICD-10-CM | POA: Diagnosis not present

## 2018-11-17 DIAGNOSIS — F0280 Dementia in other diseases classified elsewhere without behavioral disturbance: Secondary | ICD-10-CM | POA: Diagnosis not present

## 2018-11-17 DIAGNOSIS — W19XXXD Unspecified fall, subsequent encounter: Secondary | ICD-10-CM | POA: Diagnosis not present

## 2018-11-17 DIAGNOSIS — D509 Iron deficiency anemia, unspecified: Secondary | ICD-10-CM | POA: Diagnosis not present

## 2018-11-17 DIAGNOSIS — Z794 Long term (current) use of insulin: Secondary | ICD-10-CM | POA: Diagnosis not present

## 2018-11-18 DIAGNOSIS — F0280 Dementia in other diseases classified elsewhere without behavioral disturbance: Secondary | ICD-10-CM | POA: Diagnosis not present

## 2018-11-18 DIAGNOSIS — D509 Iron deficiency anemia, unspecified: Secondary | ICD-10-CM | POA: Diagnosis not present

## 2018-11-18 DIAGNOSIS — E039 Hypothyroidism, unspecified: Secondary | ICD-10-CM | POA: Diagnosis not present

## 2018-11-18 DIAGNOSIS — S72001D Fracture of unspecified part of neck of right femur, subsequent encounter for closed fracture with routine healing: Secondary | ICD-10-CM | POA: Diagnosis not present

## 2018-11-18 DIAGNOSIS — E119 Type 2 diabetes mellitus without complications: Secondary | ICD-10-CM | POA: Diagnosis not present

## 2018-11-18 DIAGNOSIS — I10 Essential (primary) hypertension: Secondary | ICD-10-CM | POA: Diagnosis not present

## 2018-11-19 DIAGNOSIS — E039 Hypothyroidism, unspecified: Secondary | ICD-10-CM | POA: Diagnosis not present

## 2018-11-19 DIAGNOSIS — S72001D Fracture of unspecified part of neck of right femur, subsequent encounter for closed fracture with routine healing: Secondary | ICD-10-CM | POA: Diagnosis not present

## 2018-11-19 DIAGNOSIS — I10 Essential (primary) hypertension: Secondary | ICD-10-CM | POA: Diagnosis not present

## 2018-11-19 DIAGNOSIS — D509 Iron deficiency anemia, unspecified: Secondary | ICD-10-CM | POA: Diagnosis not present

## 2018-11-19 DIAGNOSIS — F0280 Dementia in other diseases classified elsewhere without behavioral disturbance: Secondary | ICD-10-CM | POA: Diagnosis not present

## 2018-11-19 DIAGNOSIS — E119 Type 2 diabetes mellitus without complications: Secondary | ICD-10-CM | POA: Diagnosis not present

## 2018-11-20 DIAGNOSIS — M25552 Pain in left hip: Secondary | ICD-10-CM | POA: Diagnosis not present

## 2018-11-21 DIAGNOSIS — E039 Hypothyroidism, unspecified: Secondary | ICD-10-CM | POA: Diagnosis not present

## 2018-11-21 DIAGNOSIS — S72001D Fracture of unspecified part of neck of right femur, subsequent encounter for closed fracture with routine healing: Secondary | ICD-10-CM | POA: Diagnosis not present

## 2018-11-21 DIAGNOSIS — D509 Iron deficiency anemia, unspecified: Secondary | ICD-10-CM | POA: Diagnosis not present

## 2018-11-21 DIAGNOSIS — I10 Essential (primary) hypertension: Secondary | ICD-10-CM | POA: Diagnosis not present

## 2018-11-21 DIAGNOSIS — F0280 Dementia in other diseases classified elsewhere without behavioral disturbance: Secondary | ICD-10-CM | POA: Diagnosis not present

## 2018-11-21 DIAGNOSIS — E119 Type 2 diabetes mellitus without complications: Secondary | ICD-10-CM | POA: Diagnosis not present

## 2018-11-25 DIAGNOSIS — I10 Essential (primary) hypertension: Secondary | ICD-10-CM | POA: Diagnosis not present

## 2018-11-25 DIAGNOSIS — S72001D Fracture of unspecified part of neck of right femur, subsequent encounter for closed fracture with routine healing: Secondary | ICD-10-CM | POA: Diagnosis not present

## 2018-11-25 DIAGNOSIS — E119 Type 2 diabetes mellitus without complications: Secondary | ICD-10-CM | POA: Diagnosis not present

## 2018-11-25 DIAGNOSIS — E1121 Type 2 diabetes mellitus with diabetic nephropathy: Secondary | ICD-10-CM | POA: Diagnosis not present

## 2018-11-25 DIAGNOSIS — E039 Hypothyroidism, unspecified: Secondary | ICD-10-CM | POA: Diagnosis not present

## 2018-11-25 DIAGNOSIS — E785 Hyperlipidemia, unspecified: Secondary | ICD-10-CM | POA: Diagnosis not present

## 2018-11-25 DIAGNOSIS — D509 Iron deficiency anemia, unspecified: Secondary | ICD-10-CM | POA: Diagnosis not present

## 2018-11-25 DIAGNOSIS — F0280 Dementia in other diseases classified elsewhere without behavioral disturbance: Secondary | ICD-10-CM | POA: Diagnosis not present

## 2018-11-26 DIAGNOSIS — S72001D Fracture of unspecified part of neck of right femur, subsequent encounter for closed fracture with routine healing: Secondary | ICD-10-CM | POA: Diagnosis not present

## 2018-11-26 DIAGNOSIS — E119 Type 2 diabetes mellitus without complications: Secondary | ICD-10-CM | POA: Diagnosis not present

## 2018-11-26 DIAGNOSIS — E039 Hypothyroidism, unspecified: Secondary | ICD-10-CM | POA: Diagnosis not present

## 2018-11-26 DIAGNOSIS — D509 Iron deficiency anemia, unspecified: Secondary | ICD-10-CM | POA: Diagnosis not present

## 2018-11-26 DIAGNOSIS — F0280 Dementia in other diseases classified elsewhere without behavioral disturbance: Secondary | ICD-10-CM | POA: Diagnosis not present

## 2018-11-26 DIAGNOSIS — I10 Essential (primary) hypertension: Secondary | ICD-10-CM | POA: Diagnosis not present

## 2018-11-27 DIAGNOSIS — F0280 Dementia in other diseases classified elsewhere without behavioral disturbance: Secondary | ICD-10-CM | POA: Diagnosis not present

## 2018-11-27 DIAGNOSIS — D509 Iron deficiency anemia, unspecified: Secondary | ICD-10-CM | POA: Diagnosis not present

## 2018-11-27 DIAGNOSIS — E119 Type 2 diabetes mellitus without complications: Secondary | ICD-10-CM | POA: Diagnosis not present

## 2018-11-27 DIAGNOSIS — I10 Essential (primary) hypertension: Secondary | ICD-10-CM | POA: Diagnosis not present

## 2018-11-27 DIAGNOSIS — E039 Hypothyroidism, unspecified: Secondary | ICD-10-CM | POA: Diagnosis not present

## 2018-11-27 DIAGNOSIS — S72001D Fracture of unspecified part of neck of right femur, subsequent encounter for closed fracture with routine healing: Secondary | ICD-10-CM | POA: Diagnosis not present

## 2018-11-28 DIAGNOSIS — F0280 Dementia in other diseases classified elsewhere without behavioral disturbance: Secondary | ICD-10-CM | POA: Diagnosis not present

## 2018-11-28 DIAGNOSIS — D509 Iron deficiency anemia, unspecified: Secondary | ICD-10-CM | POA: Diagnosis not present

## 2018-11-28 DIAGNOSIS — S72001D Fracture of unspecified part of neck of right femur, subsequent encounter for closed fracture with routine healing: Secondary | ICD-10-CM | POA: Diagnosis not present

## 2018-11-28 DIAGNOSIS — E039 Hypothyroidism, unspecified: Secondary | ICD-10-CM | POA: Diagnosis not present

## 2018-11-28 DIAGNOSIS — E119 Type 2 diabetes mellitus without complications: Secondary | ICD-10-CM | POA: Diagnosis not present

## 2018-11-28 DIAGNOSIS — I10 Essential (primary) hypertension: Secondary | ICD-10-CM | POA: Diagnosis not present

## 2018-12-02 DIAGNOSIS — E119 Type 2 diabetes mellitus without complications: Secondary | ICD-10-CM | POA: Diagnosis not present

## 2018-12-02 DIAGNOSIS — D509 Iron deficiency anemia, unspecified: Secondary | ICD-10-CM | POA: Diagnosis not present

## 2018-12-02 DIAGNOSIS — F0280 Dementia in other diseases classified elsewhere without behavioral disturbance: Secondary | ICD-10-CM | POA: Diagnosis not present

## 2018-12-02 DIAGNOSIS — I10 Essential (primary) hypertension: Secondary | ICD-10-CM | POA: Diagnosis not present

## 2018-12-02 DIAGNOSIS — E039 Hypothyroidism, unspecified: Secondary | ICD-10-CM | POA: Diagnosis not present

## 2018-12-02 DIAGNOSIS — S72001D Fracture of unspecified part of neck of right femur, subsequent encounter for closed fracture with routine healing: Secondary | ICD-10-CM | POA: Diagnosis not present

## 2018-12-03 DIAGNOSIS — F0280 Dementia in other diseases classified elsewhere without behavioral disturbance: Secondary | ICD-10-CM | POA: Diagnosis not present

## 2018-12-03 DIAGNOSIS — E119 Type 2 diabetes mellitus without complications: Secondary | ICD-10-CM | POA: Diagnosis not present

## 2018-12-03 DIAGNOSIS — E039 Hypothyroidism, unspecified: Secondary | ICD-10-CM | POA: Diagnosis not present

## 2018-12-03 DIAGNOSIS — D509 Iron deficiency anemia, unspecified: Secondary | ICD-10-CM | POA: Diagnosis not present

## 2018-12-03 DIAGNOSIS — S72001D Fracture of unspecified part of neck of right femur, subsequent encounter for closed fracture with routine healing: Secondary | ICD-10-CM | POA: Diagnosis not present

## 2018-12-03 DIAGNOSIS — I10 Essential (primary) hypertension: Secondary | ICD-10-CM | POA: Diagnosis not present

## 2018-12-04 DIAGNOSIS — D509 Iron deficiency anemia, unspecified: Secondary | ICD-10-CM | POA: Diagnosis not present

## 2018-12-04 DIAGNOSIS — E119 Type 2 diabetes mellitus without complications: Secondary | ICD-10-CM | POA: Diagnosis not present

## 2018-12-04 DIAGNOSIS — S72001D Fracture of unspecified part of neck of right femur, subsequent encounter for closed fracture with routine healing: Secondary | ICD-10-CM | POA: Diagnosis not present

## 2018-12-04 DIAGNOSIS — F0280 Dementia in other diseases classified elsewhere without behavioral disturbance: Secondary | ICD-10-CM | POA: Diagnosis not present

## 2018-12-04 DIAGNOSIS — E039 Hypothyroidism, unspecified: Secondary | ICD-10-CM | POA: Diagnosis not present

## 2018-12-04 DIAGNOSIS — I10 Essential (primary) hypertension: Secondary | ICD-10-CM | POA: Diagnosis not present

## 2018-12-06 DIAGNOSIS — I10 Essential (primary) hypertension: Secondary | ICD-10-CM | POA: Diagnosis not present

## 2018-12-06 DIAGNOSIS — E119 Type 2 diabetes mellitus without complications: Secondary | ICD-10-CM | POA: Diagnosis not present

## 2018-12-06 DIAGNOSIS — F0280 Dementia in other diseases classified elsewhere without behavioral disturbance: Secondary | ICD-10-CM | POA: Diagnosis not present

## 2018-12-06 DIAGNOSIS — E039 Hypothyroidism, unspecified: Secondary | ICD-10-CM | POA: Diagnosis not present

## 2018-12-06 DIAGNOSIS — D509 Iron deficiency anemia, unspecified: Secondary | ICD-10-CM | POA: Diagnosis not present

## 2018-12-06 DIAGNOSIS — S72001D Fracture of unspecified part of neck of right femur, subsequent encounter for closed fracture with routine healing: Secondary | ICD-10-CM | POA: Diagnosis not present

## 2018-12-09 ENCOUNTER — Other Ambulatory Visit: Payer: Self-pay

## 2018-12-09 DIAGNOSIS — Z20822 Contact with and (suspected) exposure to covid-19: Secondary | ICD-10-CM

## 2018-12-09 DIAGNOSIS — Z20828 Contact with and (suspected) exposure to other viral communicable diseases: Secondary | ICD-10-CM | POA: Diagnosis not present

## 2018-12-11 LAB — NOVEL CORONAVIRUS, NAA: SARS-CoV-2, NAA: DETECTED — AB

## 2018-12-12 ENCOUNTER — Telehealth: Payer: Self-pay | Admitting: Nurse Practitioner

## 2018-12-12 NOTE — Telephone Encounter (Signed)
Called to Discuss with patient about Covid symptoms and the use of bamlanivimab, a monoclonal antibody infusion for those with mild to moderate Covid symptoms and at a high risk of hospitalization.     Pt is qualified for this infusion at the [facility] infusion center due to co-morbid conditions and/or a member of an at-risk group.    Patient is being managed for the following:  Patient Active Problem List   Diagnosis Date Noted  . Dementia ([place]) 10/11/2018  . Fall 10/11/2018  . Closed right hip fracture ([place]) 10/10/2018  . Hypokalemia 10/10/2018  . Hypertension   . HLD (hyperlipidemia)   . GERD (gastroesophageal reflux disease)   . Diabetes mellitus without complication ([place])   . Anemia   . Intertrochanteric fracture of left hip ([place]) 08/04/2013  . Hip fracture ([place]) 08/04/2013      Patient has dementia. Daughter states that she is much improved and declines treatment at this time.

## 2018-12-14 DIAGNOSIS — S72001D Fracture of unspecified part of neck of right femur, subsequent encounter for closed fracture with routine healing: Secondary | ICD-10-CM | POA: Diagnosis not present

## 2018-12-14 DIAGNOSIS — D509 Iron deficiency anemia, unspecified: Secondary | ICD-10-CM | POA: Diagnosis not present

## 2018-12-14 DIAGNOSIS — E039 Hypothyroidism, unspecified: Secondary | ICD-10-CM | POA: Diagnosis not present

## 2018-12-14 DIAGNOSIS — I10 Essential (primary) hypertension: Secondary | ICD-10-CM | POA: Diagnosis not present

## 2018-12-14 DIAGNOSIS — F0280 Dementia in other diseases classified elsewhere without behavioral disturbance: Secondary | ICD-10-CM | POA: Diagnosis not present

## 2018-12-14 DIAGNOSIS — E119 Type 2 diabetes mellitus without complications: Secondary | ICD-10-CM | POA: Diagnosis not present

## 2018-12-17 DIAGNOSIS — S72001D Fracture of unspecified part of neck of right femur, subsequent encounter for closed fracture with routine healing: Secondary | ICD-10-CM | POA: Diagnosis not present

## 2018-12-17 DIAGNOSIS — D509 Iron deficiency anemia, unspecified: Secondary | ICD-10-CM | POA: Diagnosis not present

## 2018-12-17 DIAGNOSIS — F0280 Dementia in other diseases classified elsewhere without behavioral disturbance: Secondary | ICD-10-CM | POA: Diagnosis not present

## 2018-12-17 DIAGNOSIS — R011 Cardiac murmur, unspecified: Secondary | ICD-10-CM | POA: Diagnosis not present

## 2018-12-17 DIAGNOSIS — K219 Gastro-esophageal reflux disease without esophagitis: Secondary | ICD-10-CM | POA: Diagnosis not present

## 2018-12-17 DIAGNOSIS — Z794 Long term (current) use of insulin: Secondary | ICD-10-CM | POA: Diagnosis not present

## 2018-12-17 DIAGNOSIS — E785 Hyperlipidemia, unspecified: Secondary | ICD-10-CM | POA: Diagnosis not present

## 2018-12-17 DIAGNOSIS — E039 Hypothyroidism, unspecified: Secondary | ICD-10-CM | POA: Diagnosis not present

## 2018-12-17 DIAGNOSIS — Z9181 History of falling: Secondary | ICD-10-CM | POA: Diagnosis not present

## 2018-12-17 DIAGNOSIS — E119 Type 2 diabetes mellitus without complications: Secondary | ICD-10-CM | POA: Diagnosis not present

## 2018-12-17 DIAGNOSIS — W19XXXD Unspecified fall, subsequent encounter: Secondary | ICD-10-CM | POA: Diagnosis not present

## 2018-12-17 DIAGNOSIS — I10 Essential (primary) hypertension: Secondary | ICD-10-CM | POA: Diagnosis not present

## 2018-12-17 DIAGNOSIS — Z96641 Presence of right artificial hip joint: Secondary | ICD-10-CM | POA: Diagnosis not present

## 2018-12-20 DIAGNOSIS — I10 Essential (primary) hypertension: Secondary | ICD-10-CM | POA: Diagnosis not present

## 2018-12-20 DIAGNOSIS — S72001D Fracture of unspecified part of neck of right femur, subsequent encounter for closed fracture with routine healing: Secondary | ICD-10-CM | POA: Diagnosis not present

## 2018-12-20 DIAGNOSIS — D509 Iron deficiency anemia, unspecified: Secondary | ICD-10-CM | POA: Diagnosis not present

## 2018-12-20 DIAGNOSIS — F0280 Dementia in other diseases classified elsewhere without behavioral disturbance: Secondary | ICD-10-CM | POA: Diagnosis not present

## 2018-12-20 DIAGNOSIS — E039 Hypothyroidism, unspecified: Secondary | ICD-10-CM | POA: Diagnosis not present

## 2018-12-20 DIAGNOSIS — E119 Type 2 diabetes mellitus without complications: Secondary | ICD-10-CM | POA: Diagnosis not present

## 2018-12-23 DIAGNOSIS — E119 Type 2 diabetes mellitus without complications: Secondary | ICD-10-CM | POA: Diagnosis not present

## 2018-12-23 DIAGNOSIS — E039 Hypothyroidism, unspecified: Secondary | ICD-10-CM | POA: Diagnosis not present

## 2018-12-23 DIAGNOSIS — S72001D Fracture of unspecified part of neck of right femur, subsequent encounter for closed fracture with routine healing: Secondary | ICD-10-CM | POA: Diagnosis not present

## 2018-12-23 DIAGNOSIS — F0280 Dementia in other diseases classified elsewhere without behavioral disturbance: Secondary | ICD-10-CM | POA: Diagnosis not present

## 2018-12-23 DIAGNOSIS — I10 Essential (primary) hypertension: Secondary | ICD-10-CM | POA: Diagnosis not present

## 2018-12-23 DIAGNOSIS — D509 Iron deficiency anemia, unspecified: Secondary | ICD-10-CM | POA: Diagnosis not present

## 2018-12-26 DIAGNOSIS — E039 Hypothyroidism, unspecified: Secondary | ICD-10-CM | POA: Diagnosis not present

## 2018-12-26 DIAGNOSIS — I10 Essential (primary) hypertension: Secondary | ICD-10-CM | POA: Diagnosis not present

## 2018-12-26 DIAGNOSIS — S72001D Fracture of unspecified part of neck of right femur, subsequent encounter for closed fracture with routine healing: Secondary | ICD-10-CM | POA: Diagnosis not present

## 2018-12-26 DIAGNOSIS — D509 Iron deficiency anemia, unspecified: Secondary | ICD-10-CM | POA: Diagnosis not present

## 2018-12-26 DIAGNOSIS — E119 Type 2 diabetes mellitus without complications: Secondary | ICD-10-CM | POA: Diagnosis not present

## 2018-12-26 DIAGNOSIS — F0280 Dementia in other diseases classified elsewhere without behavioral disturbance: Secondary | ICD-10-CM | POA: Diagnosis not present

## 2018-12-31 DIAGNOSIS — E119 Type 2 diabetes mellitus without complications: Secondary | ICD-10-CM | POA: Diagnosis not present

## 2018-12-31 DIAGNOSIS — F0280 Dementia in other diseases classified elsewhere without behavioral disturbance: Secondary | ICD-10-CM | POA: Diagnosis not present

## 2018-12-31 DIAGNOSIS — S72001D Fracture of unspecified part of neck of right femur, subsequent encounter for closed fracture with routine healing: Secondary | ICD-10-CM | POA: Diagnosis not present

## 2018-12-31 DIAGNOSIS — I10 Essential (primary) hypertension: Secondary | ICD-10-CM | POA: Diagnosis not present

## 2018-12-31 DIAGNOSIS — D509 Iron deficiency anemia, unspecified: Secondary | ICD-10-CM | POA: Diagnosis not present

## 2018-12-31 DIAGNOSIS — E039 Hypothyroidism, unspecified: Secondary | ICD-10-CM | POA: Diagnosis not present

## 2019-01-06 DIAGNOSIS — E119 Type 2 diabetes mellitus without complications: Secondary | ICD-10-CM | POA: Diagnosis not present

## 2019-01-06 DIAGNOSIS — E039 Hypothyroidism, unspecified: Secondary | ICD-10-CM | POA: Diagnosis not present

## 2019-01-06 DIAGNOSIS — F0280 Dementia in other diseases classified elsewhere without behavioral disturbance: Secondary | ICD-10-CM | POA: Diagnosis not present

## 2019-01-06 DIAGNOSIS — D509 Iron deficiency anemia, unspecified: Secondary | ICD-10-CM | POA: Diagnosis not present

## 2019-01-06 DIAGNOSIS — I10 Essential (primary) hypertension: Secondary | ICD-10-CM | POA: Diagnosis not present

## 2019-01-06 DIAGNOSIS — S72001D Fracture of unspecified part of neck of right femur, subsequent encounter for closed fracture with routine healing: Secondary | ICD-10-CM | POA: Diagnosis not present

## 2019-03-10 DIAGNOSIS — I352 Nonrheumatic aortic (valve) stenosis with insufficiency: Secondary | ICD-10-CM | POA: Diagnosis not present

## 2019-03-10 DIAGNOSIS — R159 Full incontinence of feces: Secondary | ICD-10-CM | POA: Diagnosis not present

## 2019-03-10 DIAGNOSIS — G309 Alzheimer's disease, unspecified: Secondary | ICD-10-CM | POA: Diagnosis not present

## 2019-03-17 DIAGNOSIS — F05 Delirium due to known physiological condition: Secondary | ICD-10-CM | POA: Diagnosis not present

## 2019-03-17 DIAGNOSIS — Z20822 Contact with and (suspected) exposure to covid-19: Secondary | ICD-10-CM | POA: Diagnosis not present

## 2019-03-17 DIAGNOSIS — G309 Alzheimer's disease, unspecified: Secondary | ICD-10-CM | POA: Diagnosis not present

## 2019-03-17 DIAGNOSIS — Z794 Long term (current) use of insulin: Secondary | ICD-10-CM | POA: Diagnosis not present

## 2019-03-17 DIAGNOSIS — I10 Essential (primary) hypertension: Secondary | ICD-10-CM | POA: Diagnosis not present

## 2019-03-17 DIAGNOSIS — R4020 Unspecified coma: Secondary | ICD-10-CM | POA: Diagnosis not present

## 2019-03-17 DIAGNOSIS — E119 Type 2 diabetes mellitus without complications: Secondary | ICD-10-CM | POA: Diagnosis not present

## 2019-03-17 DIAGNOSIS — E785 Hyperlipidemia, unspecified: Secondary | ICD-10-CM | POA: Diagnosis not present

## 2019-03-17 DIAGNOSIS — Z96643 Presence of artificial hip joint, bilateral: Secondary | ICD-10-CM | POA: Diagnosis not present

## 2019-03-17 DIAGNOSIS — E860 Dehydration: Secondary | ICD-10-CM | POA: Diagnosis not present

## 2019-03-17 DIAGNOSIS — Z90710 Acquired absence of both cervix and uterus: Secondary | ICD-10-CM | POA: Diagnosis not present

## 2019-03-17 DIAGNOSIS — Z886 Allergy status to analgesic agent status: Secondary | ICD-10-CM | POA: Diagnosis not present

## 2019-03-17 DIAGNOSIS — Z9049 Acquired absence of other specified parts of digestive tract: Secondary | ICD-10-CM | POA: Diagnosis not present

## 2019-03-17 DIAGNOSIS — K219 Gastro-esophageal reflux disease without esophagitis: Secondary | ICD-10-CM | POA: Diagnosis not present

## 2019-03-17 DIAGNOSIS — E039 Hypothyroidism, unspecified: Secondary | ICD-10-CM | POA: Diagnosis not present

## 2019-03-17 DIAGNOSIS — G459 Transient cerebral ischemic attack, unspecified: Secondary | ICD-10-CM | POA: Diagnosis not present

## 2019-03-17 DIAGNOSIS — D509 Iron deficiency anemia, unspecified: Secondary | ICD-10-CM | POA: Diagnosis not present

## 2019-03-17 DIAGNOSIS — Z888 Allergy status to other drugs, medicaments and biological substances status: Secondary | ICD-10-CM | POA: Diagnosis not present

## 2019-03-17 DIAGNOSIS — Z79899 Other long term (current) drug therapy: Secondary | ICD-10-CM | POA: Diagnosis not present

## 2019-03-17 DIAGNOSIS — R404 Transient alteration of awareness: Secondary | ICD-10-CM | POA: Diagnosis not present

## 2019-03-17 DIAGNOSIS — R55 Syncope and collapse: Secondary | ICD-10-CM | POA: Diagnosis not present

## 2019-03-17 DIAGNOSIS — F0280 Dementia in other diseases classified elsewhere without behavioral disturbance: Secondary | ICD-10-CM | POA: Diagnosis not present

## 2019-03-18 DIAGNOSIS — G309 Alzheimer's disease, unspecified: Secondary | ICD-10-CM | POA: Diagnosis not present

## 2019-03-18 DIAGNOSIS — F0280 Dementia in other diseases classified elsewhere without behavioral disturbance: Secondary | ICD-10-CM | POA: Diagnosis not present

## 2019-03-18 DIAGNOSIS — G459 Transient cerebral ischemic attack, unspecified: Secondary | ICD-10-CM | POA: Diagnosis not present

## 2019-03-18 DIAGNOSIS — E860 Dehydration: Secondary | ICD-10-CM | POA: Diagnosis not present

## 2019-04-11 DIAGNOSIS — L03031 Cellulitis of right toe: Secondary | ICD-10-CM | POA: Diagnosis not present

## 2019-04-17 DIAGNOSIS — E1121 Type 2 diabetes mellitus with diabetic nephropathy: Secondary | ICD-10-CM | POA: Diagnosis not present

## 2019-04-17 DIAGNOSIS — L89891 Pressure ulcer of other site, stage 1: Secondary | ICD-10-CM | POA: Diagnosis not present

## 2019-04-17 DIAGNOSIS — F0281 Dementia in other diseases classified elsewhere with behavioral disturbance: Secondary | ICD-10-CM | POA: Diagnosis not present

## 2019-04-17 DIAGNOSIS — G309 Alzheimer's disease, unspecified: Secondary | ICD-10-CM | POA: Diagnosis not present

## 2019-04-19 DIAGNOSIS — L89891 Pressure ulcer of other site, stage 1: Secondary | ICD-10-CM | POA: Diagnosis not present

## 2019-04-19 DIAGNOSIS — Z794 Long term (current) use of insulin: Secondary | ICD-10-CM | POA: Diagnosis not present

## 2019-04-19 DIAGNOSIS — S90411D Abrasion, right great toe, subsequent encounter: Secondary | ICD-10-CM | POA: Diagnosis not present

## 2019-04-19 DIAGNOSIS — G309 Alzheimer's disease, unspecified: Secondary | ICD-10-CM | POA: Diagnosis not present

## 2019-04-19 DIAGNOSIS — E1121 Type 2 diabetes mellitus with diabetic nephropathy: Secondary | ICD-10-CM | POA: Diagnosis not present

## 2019-04-19 DIAGNOSIS — F0280 Dementia in other diseases classified elsewhere without behavioral disturbance: Secondary | ICD-10-CM | POA: Diagnosis not present

## 2019-04-22 DIAGNOSIS — L89891 Pressure ulcer of other site, stage 1: Secondary | ICD-10-CM | POA: Diagnosis not present

## 2019-04-22 DIAGNOSIS — G309 Alzheimer's disease, unspecified: Secondary | ICD-10-CM | POA: Diagnosis not present

## 2019-04-22 DIAGNOSIS — S90411D Abrasion, right great toe, subsequent encounter: Secondary | ICD-10-CM | POA: Diagnosis not present

## 2019-04-22 DIAGNOSIS — F0280 Dementia in other diseases classified elsewhere without behavioral disturbance: Secondary | ICD-10-CM | POA: Diagnosis not present

## 2019-04-22 DIAGNOSIS — E1121 Type 2 diabetes mellitus with diabetic nephropathy: Secondary | ICD-10-CM | POA: Diagnosis not present

## 2019-04-22 DIAGNOSIS — Z794 Long term (current) use of insulin: Secondary | ICD-10-CM | POA: Diagnosis not present

## 2019-04-25 DIAGNOSIS — Z794 Long term (current) use of insulin: Secondary | ICD-10-CM | POA: Diagnosis not present

## 2019-04-25 DIAGNOSIS — L89891 Pressure ulcer of other site, stage 1: Secondary | ICD-10-CM | POA: Diagnosis not present

## 2019-04-25 DIAGNOSIS — F0280 Dementia in other diseases classified elsewhere without behavioral disturbance: Secondary | ICD-10-CM | POA: Diagnosis not present

## 2019-04-25 DIAGNOSIS — E1121 Type 2 diabetes mellitus with diabetic nephropathy: Secondary | ICD-10-CM | POA: Diagnosis not present

## 2019-04-25 DIAGNOSIS — G309 Alzheimer's disease, unspecified: Secondary | ICD-10-CM | POA: Diagnosis not present

## 2019-04-25 DIAGNOSIS — S90411D Abrasion, right great toe, subsequent encounter: Secondary | ICD-10-CM | POA: Diagnosis not present

## 2019-04-30 DIAGNOSIS — E1121 Type 2 diabetes mellitus with diabetic nephropathy: Secondary | ICD-10-CM | POA: Diagnosis not present

## 2019-04-30 DIAGNOSIS — S90411D Abrasion, right great toe, subsequent encounter: Secondary | ICD-10-CM | POA: Diagnosis not present

## 2019-04-30 DIAGNOSIS — L89891 Pressure ulcer of other site, stage 1: Secondary | ICD-10-CM | POA: Diagnosis not present

## 2019-04-30 DIAGNOSIS — F0280 Dementia in other diseases classified elsewhere without behavioral disturbance: Secondary | ICD-10-CM | POA: Diagnosis not present

## 2019-04-30 DIAGNOSIS — Z794 Long term (current) use of insulin: Secondary | ICD-10-CM | POA: Diagnosis not present

## 2019-04-30 DIAGNOSIS — G309 Alzheimer's disease, unspecified: Secondary | ICD-10-CM | POA: Diagnosis not present

## 2019-05-08 DIAGNOSIS — E1121 Type 2 diabetes mellitus with diabetic nephropathy: Secondary | ICD-10-CM | POA: Diagnosis not present

## 2019-05-08 DIAGNOSIS — L89891 Pressure ulcer of other site, stage 1: Secondary | ICD-10-CM | POA: Diagnosis not present

## 2019-05-08 DIAGNOSIS — S90411D Abrasion, right great toe, subsequent encounter: Secondary | ICD-10-CM | POA: Diagnosis not present

## 2019-05-08 DIAGNOSIS — Z794 Long term (current) use of insulin: Secondary | ICD-10-CM | POA: Diagnosis not present

## 2019-05-08 DIAGNOSIS — F0280 Dementia in other diseases classified elsewhere without behavioral disturbance: Secondary | ICD-10-CM | POA: Diagnosis not present

## 2019-05-08 DIAGNOSIS — G309 Alzheimer's disease, unspecified: Secondary | ICD-10-CM | POA: Diagnosis not present

## 2019-05-12 DIAGNOSIS — E1121 Type 2 diabetes mellitus with diabetic nephropathy: Secondary | ICD-10-CM | POA: Diagnosis not present

## 2019-05-12 DIAGNOSIS — L89891 Pressure ulcer of other site, stage 1: Secondary | ICD-10-CM | POA: Diagnosis not present

## 2019-05-12 DIAGNOSIS — F0280 Dementia in other diseases classified elsewhere without behavioral disturbance: Secondary | ICD-10-CM | POA: Diagnosis not present

## 2019-05-12 DIAGNOSIS — S90411D Abrasion, right great toe, subsequent encounter: Secondary | ICD-10-CM | POA: Diagnosis not present

## 2019-05-12 DIAGNOSIS — Z794 Long term (current) use of insulin: Secondary | ICD-10-CM | POA: Diagnosis not present

## 2019-05-12 DIAGNOSIS — G309 Alzheimer's disease, unspecified: Secondary | ICD-10-CM | POA: Diagnosis not present

## 2019-05-19 DIAGNOSIS — S90411D Abrasion, right great toe, subsequent encounter: Secondary | ICD-10-CM | POA: Diagnosis not present

## 2019-05-19 DIAGNOSIS — G309 Alzheimer's disease, unspecified: Secondary | ICD-10-CM | POA: Diagnosis not present

## 2019-05-19 DIAGNOSIS — E1121 Type 2 diabetes mellitus with diabetic nephropathy: Secondary | ICD-10-CM | POA: Diagnosis not present

## 2019-05-19 DIAGNOSIS — L89891 Pressure ulcer of other site, stage 1: Secondary | ICD-10-CM | POA: Diagnosis not present

## 2019-05-19 DIAGNOSIS — Z794 Long term (current) use of insulin: Secondary | ICD-10-CM | POA: Diagnosis not present

## 2019-05-19 DIAGNOSIS — F0280 Dementia in other diseases classified elsewhere without behavioral disturbance: Secondary | ICD-10-CM | POA: Diagnosis not present

## 2019-05-20 DIAGNOSIS — S90411D Abrasion, right great toe, subsequent encounter: Secondary | ICD-10-CM | POA: Diagnosis not present

## 2019-05-20 DIAGNOSIS — E1121 Type 2 diabetes mellitus with diabetic nephropathy: Secondary | ICD-10-CM | POA: Diagnosis not present

## 2019-05-20 DIAGNOSIS — F0280 Dementia in other diseases classified elsewhere without behavioral disturbance: Secondary | ICD-10-CM | POA: Diagnosis not present

## 2019-05-20 DIAGNOSIS — G309 Alzheimer's disease, unspecified: Secondary | ICD-10-CM | POA: Diagnosis not present

## 2019-05-20 DIAGNOSIS — L89891 Pressure ulcer of other site, stage 1: Secondary | ICD-10-CM | POA: Diagnosis not present

## 2019-05-20 DIAGNOSIS — Z794 Long term (current) use of insulin: Secondary | ICD-10-CM | POA: Diagnosis not present

## 2019-05-26 DIAGNOSIS — Z794 Long term (current) use of insulin: Secondary | ICD-10-CM | POA: Diagnosis not present

## 2019-05-26 DIAGNOSIS — S90411D Abrasion, right great toe, subsequent encounter: Secondary | ICD-10-CM | POA: Diagnosis not present

## 2019-05-26 DIAGNOSIS — L89891 Pressure ulcer of other site, stage 1: Secondary | ICD-10-CM | POA: Diagnosis not present

## 2019-05-26 DIAGNOSIS — E1121 Type 2 diabetes mellitus with diabetic nephropathy: Secondary | ICD-10-CM | POA: Diagnosis not present

## 2019-05-26 DIAGNOSIS — G309 Alzheimer's disease, unspecified: Secondary | ICD-10-CM | POA: Diagnosis not present

## 2019-05-26 DIAGNOSIS — F0280 Dementia in other diseases classified elsewhere without behavioral disturbance: Secondary | ICD-10-CM | POA: Diagnosis not present

## 2019-06-03 DIAGNOSIS — F0280 Dementia in other diseases classified elsewhere without behavioral disturbance: Secondary | ICD-10-CM | POA: Diagnosis not present

## 2019-06-03 DIAGNOSIS — S90411D Abrasion, right great toe, subsequent encounter: Secondary | ICD-10-CM | POA: Diagnosis not present

## 2019-06-03 DIAGNOSIS — G309 Alzheimer's disease, unspecified: Secondary | ICD-10-CM | POA: Diagnosis not present

## 2019-06-03 DIAGNOSIS — L89891 Pressure ulcer of other site, stage 1: Secondary | ICD-10-CM | POA: Diagnosis not present

## 2019-06-03 DIAGNOSIS — E1121 Type 2 diabetes mellitus with diabetic nephropathy: Secondary | ICD-10-CM | POA: Diagnosis not present

## 2019-06-03 DIAGNOSIS — Z794 Long term (current) use of insulin: Secondary | ICD-10-CM | POA: Diagnosis not present

## 2019-07-24 DIAGNOSIS — G309 Alzheimer's disease, unspecified: Secondary | ICD-10-CM | POA: Diagnosis not present

## 2019-07-24 DIAGNOSIS — E1121 Type 2 diabetes mellitus with diabetic nephropathy: Secondary | ICD-10-CM | POA: Diagnosis not present

## 2019-07-24 DIAGNOSIS — R197 Diarrhea, unspecified: Secondary | ICD-10-CM | POA: Diagnosis not present

## 2019-07-24 DIAGNOSIS — F0281 Dementia in other diseases classified elsewhere with behavioral disturbance: Secondary | ICD-10-CM | POA: Diagnosis not present

## 2019-07-24 DIAGNOSIS — L89891 Pressure ulcer of other site, stage 1: Secondary | ICD-10-CM | POA: Diagnosis not present

## 2019-08-06 DIAGNOSIS — G4701 Insomnia due to medical condition: Secondary | ICD-10-CM | POA: Diagnosis not present

## 2019-08-06 DIAGNOSIS — F0151 Vascular dementia with behavioral disturbance: Secondary | ICD-10-CM | POA: Diagnosis not present

## 2019-08-06 DIAGNOSIS — F0281 Dementia in other diseases classified elsewhere with behavioral disturbance: Secondary | ICD-10-CM | POA: Diagnosis not present

## 2019-08-07 DIAGNOSIS — Z23 Encounter for immunization: Secondary | ICD-10-CM | POA: Diagnosis not present

## 2019-08-26 DIAGNOSIS — I872 Venous insufficiency (chronic) (peripheral): Secondary | ICD-10-CM | POA: Diagnosis not present

## 2019-09-04 DIAGNOSIS — Z23 Encounter for immunization: Secondary | ICD-10-CM | POA: Diagnosis not present

## 2019-10-06 DIAGNOSIS — Z888 Allergy status to other drugs, medicaments and biological substances status: Secondary | ICD-10-CM | POA: Diagnosis not present

## 2019-10-06 DIAGNOSIS — S42212A Unspecified displaced fracture of surgical neck of left humerus, initial encounter for closed fracture: Secondary | ICD-10-CM | POA: Diagnosis not present

## 2019-10-06 DIAGNOSIS — W19XXXA Unspecified fall, initial encounter: Secondary | ICD-10-CM | POA: Diagnosis not present

## 2019-10-06 DIAGNOSIS — S42292A Other displaced fracture of upper end of left humerus, initial encounter for closed fracture: Secondary | ICD-10-CM | POA: Diagnosis not present

## 2019-10-06 DIAGNOSIS — R609 Edema, unspecified: Secondary | ICD-10-CM | POA: Diagnosis not present

## 2019-10-06 DIAGNOSIS — M25512 Pain in left shoulder: Secondary | ICD-10-CM | POA: Diagnosis not present

## 2019-10-06 DIAGNOSIS — M7989 Other specified soft tissue disorders: Secondary | ICD-10-CM | POA: Diagnosis not present

## 2019-10-06 DIAGNOSIS — Z794 Long term (current) use of insulin: Secondary | ICD-10-CM | POA: Diagnosis not present

## 2019-10-06 DIAGNOSIS — Z885 Allergy status to narcotic agent status: Secondary | ICD-10-CM | POA: Diagnosis not present

## 2019-10-06 DIAGNOSIS — E079 Disorder of thyroid, unspecified: Secondary | ICD-10-CM | POA: Diagnosis not present

## 2019-10-06 DIAGNOSIS — M799 Soft tissue disorder, unspecified: Secondary | ICD-10-CM | POA: Diagnosis not present

## 2019-10-06 DIAGNOSIS — E119 Type 2 diabetes mellitus without complications: Secondary | ICD-10-CM | POA: Diagnosis not present

## 2019-10-06 DIAGNOSIS — Z7982 Long term (current) use of aspirin: Secondary | ICD-10-CM | POA: Diagnosis not present

## 2019-10-06 DIAGNOSIS — M25519 Pain in unspecified shoulder: Secondary | ICD-10-CM | POA: Diagnosis not present

## 2019-10-06 DIAGNOSIS — S42202A Unspecified fracture of upper end of left humerus, initial encounter for closed fracture: Secondary | ICD-10-CM | POA: Diagnosis not present

## 2019-10-06 DIAGNOSIS — F0390 Unspecified dementia without behavioral disturbance: Secondary | ICD-10-CM | POA: Diagnosis not present

## 2019-10-06 DIAGNOSIS — M19012 Primary osteoarthritis, left shoulder: Secondary | ICD-10-CM | POA: Diagnosis not present

## 2019-10-06 DIAGNOSIS — I10 Essential (primary) hypertension: Secondary | ICD-10-CM | POA: Diagnosis not present

## 2019-10-06 DIAGNOSIS — S42352A Displaced comminuted fracture of shaft of humerus, left arm, initial encounter for closed fracture: Secondary | ICD-10-CM | POA: Diagnosis not present

## 2019-10-06 DIAGNOSIS — W06XXXA Fall from bed, initial encounter: Secondary | ICD-10-CM | POA: Diagnosis not present

## 2019-10-06 DIAGNOSIS — S4292XA Fracture of left shoulder girdle, part unspecified, initial encounter for closed fracture: Secondary | ICD-10-CM | POA: Diagnosis not present

## 2019-10-06 DIAGNOSIS — Z79899 Other long term (current) drug therapy: Secondary | ICD-10-CM | POA: Diagnosis not present

## 2019-10-06 DIAGNOSIS — R52 Pain, unspecified: Secondary | ICD-10-CM | POA: Diagnosis not present

## 2019-10-10 DIAGNOSIS — S42212A Unspecified displaced fracture of surgical neck of left humerus, initial encounter for closed fracture: Secondary | ICD-10-CM | POA: Diagnosis not present

## 2019-10-14 DIAGNOSIS — Z515 Encounter for palliative care: Secondary | ICD-10-CM | POA: Diagnosis not present

## 2019-10-14 DIAGNOSIS — F0281 Dementia in other diseases classified elsewhere with behavioral disturbance: Secondary | ICD-10-CM | POA: Diagnosis not present

## 2019-10-14 DIAGNOSIS — G301 Alzheimer's disease with late onset: Secondary | ICD-10-CM | POA: Diagnosis not present

## 2019-10-14 DIAGNOSIS — S42212D Unspecified displaced fracture of surgical neck of left humerus, subsequent encounter for fracture with routine healing: Secondary | ICD-10-CM | POA: Diagnosis not present

## 2019-10-16 DIAGNOSIS — F0280 Dementia in other diseases classified elsewhere without behavioral disturbance: Secondary | ICD-10-CM | POA: Diagnosis not present

## 2019-10-16 DIAGNOSIS — E079 Disorder of thyroid, unspecified: Secondary | ICD-10-CM | POA: Diagnosis not present

## 2019-10-16 DIAGNOSIS — E1140 Type 2 diabetes mellitus with diabetic neuropathy, unspecified: Secondary | ICD-10-CM | POA: Diagnosis not present

## 2019-10-16 DIAGNOSIS — G309 Alzheimer's disease, unspecified: Secondary | ICD-10-CM | POA: Diagnosis not present

## 2019-10-16 DIAGNOSIS — S42212A Unspecified displaced fracture of surgical neck of left humerus, initial encounter for closed fracture: Secondary | ICD-10-CM | POA: Diagnosis not present

## 2019-10-16 DIAGNOSIS — Z794 Long term (current) use of insulin: Secondary | ICD-10-CM | POA: Diagnosis not present

## 2019-10-16 DIAGNOSIS — I10 Essential (primary) hypertension: Secondary | ICD-10-CM | POA: Diagnosis not present

## 2019-10-16 DIAGNOSIS — W19XXXD Unspecified fall, subsequent encounter: Secondary | ICD-10-CM | POA: Diagnosis not present

## 2019-10-16 DIAGNOSIS — S42212D Unspecified displaced fracture of surgical neck of left humerus, subsequent encounter for fracture with routine healing: Secondary | ICD-10-CM | POA: Diagnosis not present

## 2019-10-16 DIAGNOSIS — Z9181 History of falling: Secondary | ICD-10-CM | POA: Diagnosis not present

## 2019-10-17 DIAGNOSIS — E1140 Type 2 diabetes mellitus with diabetic neuropathy, unspecified: Secondary | ICD-10-CM | POA: Diagnosis not present

## 2019-10-17 DIAGNOSIS — W19XXXD Unspecified fall, subsequent encounter: Secondary | ICD-10-CM | POA: Diagnosis not present

## 2019-10-17 DIAGNOSIS — I10 Essential (primary) hypertension: Secondary | ICD-10-CM | POA: Diagnosis not present

## 2019-10-17 DIAGNOSIS — G309 Alzheimer's disease, unspecified: Secondary | ICD-10-CM | POA: Diagnosis not present

## 2019-10-17 DIAGNOSIS — F0280 Dementia in other diseases classified elsewhere without behavioral disturbance: Secondary | ICD-10-CM | POA: Diagnosis not present

## 2019-10-17 DIAGNOSIS — S42212D Unspecified displaced fracture of surgical neck of left humerus, subsequent encounter for fracture with routine healing: Secondary | ICD-10-CM | POA: Diagnosis not present

## 2019-10-20 DIAGNOSIS — I10 Essential (primary) hypertension: Secondary | ICD-10-CM | POA: Diagnosis not present

## 2019-10-20 DIAGNOSIS — S42212D Unspecified displaced fracture of surgical neck of left humerus, subsequent encounter for fracture with routine healing: Secondary | ICD-10-CM | POA: Diagnosis not present

## 2019-10-20 DIAGNOSIS — E1140 Type 2 diabetes mellitus with diabetic neuropathy, unspecified: Secondary | ICD-10-CM | POA: Diagnosis not present

## 2019-10-20 DIAGNOSIS — W19XXXD Unspecified fall, subsequent encounter: Secondary | ICD-10-CM | POA: Diagnosis not present

## 2019-10-20 DIAGNOSIS — F0280 Dementia in other diseases classified elsewhere without behavioral disturbance: Secondary | ICD-10-CM | POA: Diagnosis not present

## 2019-10-20 DIAGNOSIS — G309 Alzheimer's disease, unspecified: Secondary | ICD-10-CM | POA: Diagnosis not present

## 2019-10-22 DIAGNOSIS — E1140 Type 2 diabetes mellitus with diabetic neuropathy, unspecified: Secondary | ICD-10-CM | POA: Diagnosis not present

## 2019-10-22 DIAGNOSIS — F0280 Dementia in other diseases classified elsewhere without behavioral disturbance: Secondary | ICD-10-CM | POA: Diagnosis not present

## 2019-10-22 DIAGNOSIS — I10 Essential (primary) hypertension: Secondary | ICD-10-CM | POA: Diagnosis not present

## 2019-10-22 DIAGNOSIS — G309 Alzheimer's disease, unspecified: Secondary | ICD-10-CM | POA: Diagnosis not present

## 2019-10-22 DIAGNOSIS — W19XXXD Unspecified fall, subsequent encounter: Secondary | ICD-10-CM | POA: Diagnosis not present

## 2019-10-22 DIAGNOSIS — S42212D Unspecified displaced fracture of surgical neck of left humerus, subsequent encounter for fracture with routine healing: Secondary | ICD-10-CM | POA: Diagnosis not present

## 2019-10-24 DIAGNOSIS — S42212D Unspecified displaced fracture of surgical neck of left humerus, subsequent encounter for fracture with routine healing: Secondary | ICD-10-CM | POA: Diagnosis not present

## 2019-10-24 DIAGNOSIS — F0280 Dementia in other diseases classified elsewhere without behavioral disturbance: Secondary | ICD-10-CM | POA: Diagnosis not present

## 2019-10-24 DIAGNOSIS — W19XXXD Unspecified fall, subsequent encounter: Secondary | ICD-10-CM | POA: Diagnosis not present

## 2019-10-24 DIAGNOSIS — G309 Alzheimer's disease, unspecified: Secondary | ICD-10-CM | POA: Diagnosis not present

## 2019-10-24 DIAGNOSIS — E1140 Type 2 diabetes mellitus with diabetic neuropathy, unspecified: Secondary | ICD-10-CM | POA: Diagnosis not present

## 2019-10-24 DIAGNOSIS — I10 Essential (primary) hypertension: Secondary | ICD-10-CM | POA: Diagnosis not present

## 2019-10-27 DIAGNOSIS — E1140 Type 2 diabetes mellitus with diabetic neuropathy, unspecified: Secondary | ICD-10-CM | POA: Diagnosis not present

## 2019-10-27 DIAGNOSIS — F0280 Dementia in other diseases classified elsewhere without behavioral disturbance: Secondary | ICD-10-CM | POA: Diagnosis not present

## 2019-10-27 DIAGNOSIS — G309 Alzheimer's disease, unspecified: Secondary | ICD-10-CM | POA: Diagnosis not present

## 2019-10-27 DIAGNOSIS — I10 Essential (primary) hypertension: Secondary | ICD-10-CM | POA: Diagnosis not present

## 2019-10-27 DIAGNOSIS — S42212D Unspecified displaced fracture of surgical neck of left humerus, subsequent encounter for fracture with routine healing: Secondary | ICD-10-CM | POA: Diagnosis not present

## 2019-10-27 DIAGNOSIS — W19XXXD Unspecified fall, subsequent encounter: Secondary | ICD-10-CM | POA: Diagnosis not present

## 2019-10-28 DIAGNOSIS — G309 Alzheimer's disease, unspecified: Secondary | ICD-10-CM | POA: Diagnosis not present

## 2019-10-28 DIAGNOSIS — S42212D Unspecified displaced fracture of surgical neck of left humerus, subsequent encounter for fracture with routine healing: Secondary | ICD-10-CM | POA: Diagnosis not present

## 2019-10-28 DIAGNOSIS — E1140 Type 2 diabetes mellitus with diabetic neuropathy, unspecified: Secondary | ICD-10-CM | POA: Diagnosis not present

## 2019-10-28 DIAGNOSIS — W19XXXD Unspecified fall, subsequent encounter: Secondary | ICD-10-CM | POA: Diagnosis not present

## 2019-10-28 DIAGNOSIS — F0280 Dementia in other diseases classified elsewhere without behavioral disturbance: Secondary | ICD-10-CM | POA: Diagnosis not present

## 2019-10-28 DIAGNOSIS — I10 Essential (primary) hypertension: Secondary | ICD-10-CM | POA: Diagnosis not present

## 2019-10-29 DIAGNOSIS — I10 Essential (primary) hypertension: Secondary | ICD-10-CM | POA: Diagnosis not present

## 2019-10-29 DIAGNOSIS — W19XXXD Unspecified fall, subsequent encounter: Secondary | ICD-10-CM | POA: Diagnosis not present

## 2019-10-29 DIAGNOSIS — F0280 Dementia in other diseases classified elsewhere without behavioral disturbance: Secondary | ICD-10-CM | POA: Diagnosis not present

## 2019-10-29 DIAGNOSIS — S42212D Unspecified displaced fracture of surgical neck of left humerus, subsequent encounter for fracture with routine healing: Secondary | ICD-10-CM | POA: Diagnosis not present

## 2019-10-29 DIAGNOSIS — G309 Alzheimer's disease, unspecified: Secondary | ICD-10-CM | POA: Diagnosis not present

## 2019-10-29 DIAGNOSIS — E1140 Type 2 diabetes mellitus with diabetic neuropathy, unspecified: Secondary | ICD-10-CM | POA: Diagnosis not present

## 2019-10-31 DIAGNOSIS — G301 Alzheimer's disease with late onset: Secondary | ICD-10-CM | POA: Diagnosis not present

## 2019-10-31 DIAGNOSIS — F0281 Dementia in other diseases classified elsewhere with behavioral disturbance: Secondary | ICD-10-CM | POA: Diagnosis not present

## 2019-10-31 DIAGNOSIS — Z515 Encounter for palliative care: Secondary | ICD-10-CM | POA: Diagnosis not present

## 2019-10-31 DIAGNOSIS — E1140 Type 2 diabetes mellitus with diabetic neuropathy, unspecified: Secondary | ICD-10-CM | POA: Diagnosis not present

## 2019-10-31 DIAGNOSIS — W19XXXD Unspecified fall, subsequent encounter: Secondary | ICD-10-CM | POA: Diagnosis not present

## 2019-10-31 DIAGNOSIS — S42212D Unspecified displaced fracture of surgical neck of left humerus, subsequent encounter for fracture with routine healing: Secondary | ICD-10-CM | POA: Diagnosis not present

## 2019-10-31 DIAGNOSIS — G309 Alzheimer's disease, unspecified: Secondary | ICD-10-CM | POA: Diagnosis not present

## 2019-10-31 DIAGNOSIS — I10 Essential (primary) hypertension: Secondary | ICD-10-CM | POA: Diagnosis not present

## 2019-10-31 DIAGNOSIS — F0280 Dementia in other diseases classified elsewhere without behavioral disturbance: Secondary | ICD-10-CM | POA: Diagnosis not present

## 2019-11-03 DIAGNOSIS — S42212D Unspecified displaced fracture of surgical neck of left humerus, subsequent encounter for fracture with routine healing: Secondary | ICD-10-CM | POA: Diagnosis not present

## 2019-11-03 DIAGNOSIS — F0280 Dementia in other diseases classified elsewhere without behavioral disturbance: Secondary | ICD-10-CM | POA: Diagnosis not present

## 2019-11-03 DIAGNOSIS — G309 Alzheimer's disease, unspecified: Secondary | ICD-10-CM | POA: Diagnosis not present

## 2019-11-03 DIAGNOSIS — I10 Essential (primary) hypertension: Secondary | ICD-10-CM | POA: Diagnosis not present

## 2019-11-03 DIAGNOSIS — E1140 Type 2 diabetes mellitus with diabetic neuropathy, unspecified: Secondary | ICD-10-CM | POA: Diagnosis not present

## 2019-11-03 DIAGNOSIS — W19XXXD Unspecified fall, subsequent encounter: Secondary | ICD-10-CM | POA: Diagnosis not present

## 2019-11-07 DIAGNOSIS — S42212D Unspecified displaced fracture of surgical neck of left humerus, subsequent encounter for fracture with routine healing: Secondary | ICD-10-CM | POA: Diagnosis not present

## 2019-11-07 DIAGNOSIS — I10 Essential (primary) hypertension: Secondary | ICD-10-CM | POA: Diagnosis not present

## 2019-11-07 DIAGNOSIS — E1140 Type 2 diabetes mellitus with diabetic neuropathy, unspecified: Secondary | ICD-10-CM | POA: Diagnosis not present

## 2019-11-07 DIAGNOSIS — S42212A Unspecified displaced fracture of surgical neck of left humerus, initial encounter for closed fracture: Secondary | ICD-10-CM | POA: Diagnosis not present

## 2019-11-07 DIAGNOSIS — F0280 Dementia in other diseases classified elsewhere without behavioral disturbance: Secondary | ICD-10-CM | POA: Diagnosis not present

## 2019-11-07 DIAGNOSIS — W19XXXD Unspecified fall, subsequent encounter: Secondary | ICD-10-CM | POA: Diagnosis not present

## 2019-11-07 DIAGNOSIS — G309 Alzheimer's disease, unspecified: Secondary | ICD-10-CM | POA: Diagnosis not present

## 2019-11-10 DIAGNOSIS — S42212D Unspecified displaced fracture of surgical neck of left humerus, subsequent encounter for fracture with routine healing: Secondary | ICD-10-CM | POA: Diagnosis not present

## 2019-11-10 DIAGNOSIS — E1140 Type 2 diabetes mellitus with diabetic neuropathy, unspecified: Secondary | ICD-10-CM | POA: Diagnosis not present

## 2019-11-10 DIAGNOSIS — F0280 Dementia in other diseases classified elsewhere without behavioral disturbance: Secondary | ICD-10-CM | POA: Diagnosis not present

## 2019-11-10 DIAGNOSIS — G309 Alzheimer's disease, unspecified: Secondary | ICD-10-CM | POA: Diagnosis not present

## 2019-11-10 DIAGNOSIS — W19XXXD Unspecified fall, subsequent encounter: Secondary | ICD-10-CM | POA: Diagnosis not present

## 2019-11-10 DIAGNOSIS — I10 Essential (primary) hypertension: Secondary | ICD-10-CM | POA: Diagnosis not present

## 2019-11-12 DIAGNOSIS — W19XXXD Unspecified fall, subsequent encounter: Secondary | ICD-10-CM | POA: Diagnosis not present

## 2019-11-12 DIAGNOSIS — E1140 Type 2 diabetes mellitus with diabetic neuropathy, unspecified: Secondary | ICD-10-CM | POA: Diagnosis not present

## 2019-11-12 DIAGNOSIS — G309 Alzheimer's disease, unspecified: Secondary | ICD-10-CM | POA: Diagnosis not present

## 2019-11-12 DIAGNOSIS — I10 Essential (primary) hypertension: Secondary | ICD-10-CM | POA: Diagnosis not present

## 2019-11-12 DIAGNOSIS — S42212D Unspecified displaced fracture of surgical neck of left humerus, subsequent encounter for fracture with routine healing: Secondary | ICD-10-CM | POA: Diagnosis not present

## 2019-11-12 DIAGNOSIS — F0280 Dementia in other diseases classified elsewhere without behavioral disturbance: Secondary | ICD-10-CM | POA: Diagnosis not present

## 2019-11-13 DIAGNOSIS — W19XXXD Unspecified fall, subsequent encounter: Secondary | ICD-10-CM | POA: Diagnosis not present

## 2019-11-13 DIAGNOSIS — I10 Essential (primary) hypertension: Secondary | ICD-10-CM | POA: Diagnosis not present

## 2019-11-13 DIAGNOSIS — S42212D Unspecified displaced fracture of surgical neck of left humerus, subsequent encounter for fracture with routine healing: Secondary | ICD-10-CM | POA: Diagnosis not present

## 2019-11-13 DIAGNOSIS — G309 Alzheimer's disease, unspecified: Secondary | ICD-10-CM | POA: Diagnosis not present

## 2019-11-13 DIAGNOSIS — F0280 Dementia in other diseases classified elsewhere without behavioral disturbance: Secondary | ICD-10-CM | POA: Diagnosis not present

## 2019-11-13 DIAGNOSIS — E1140 Type 2 diabetes mellitus with diabetic neuropathy, unspecified: Secondary | ICD-10-CM | POA: Diagnosis not present

## 2019-11-14 DIAGNOSIS — I10 Essential (primary) hypertension: Secondary | ICD-10-CM | POA: Diagnosis not present

## 2019-11-14 DIAGNOSIS — F0280 Dementia in other diseases classified elsewhere without behavioral disturbance: Secondary | ICD-10-CM | POA: Diagnosis not present

## 2019-11-14 DIAGNOSIS — G309 Alzheimer's disease, unspecified: Secondary | ICD-10-CM | POA: Diagnosis not present

## 2019-11-14 DIAGNOSIS — Z23 Encounter for immunization: Secondary | ICD-10-CM | POA: Diagnosis not present

## 2019-11-14 DIAGNOSIS — E1140 Type 2 diabetes mellitus with diabetic neuropathy, unspecified: Secondary | ICD-10-CM | POA: Diagnosis not present

## 2019-11-14 DIAGNOSIS — W19XXXD Unspecified fall, subsequent encounter: Secondary | ICD-10-CM | POA: Diagnosis not present

## 2019-11-14 DIAGNOSIS — S42212D Unspecified displaced fracture of surgical neck of left humerus, subsequent encounter for fracture with routine healing: Secondary | ICD-10-CM | POA: Diagnosis not present

## 2019-11-15 DIAGNOSIS — Z794 Long term (current) use of insulin: Secondary | ICD-10-CM | POA: Diagnosis not present

## 2019-11-15 DIAGNOSIS — S42212D Unspecified displaced fracture of surgical neck of left humerus, subsequent encounter for fracture with routine healing: Secondary | ICD-10-CM | POA: Diagnosis not present

## 2019-11-15 DIAGNOSIS — I10 Essential (primary) hypertension: Secondary | ICD-10-CM | POA: Diagnosis not present

## 2019-11-15 DIAGNOSIS — F0280 Dementia in other diseases classified elsewhere without behavioral disturbance: Secondary | ICD-10-CM | POA: Diagnosis not present

## 2019-11-15 DIAGNOSIS — W19XXXD Unspecified fall, subsequent encounter: Secondary | ICD-10-CM | POA: Diagnosis not present

## 2019-11-15 DIAGNOSIS — G309 Alzheimer's disease, unspecified: Secondary | ICD-10-CM | POA: Diagnosis not present

## 2019-11-15 DIAGNOSIS — E079 Disorder of thyroid, unspecified: Secondary | ICD-10-CM | POA: Diagnosis not present

## 2019-11-15 DIAGNOSIS — Z9181 History of falling: Secondary | ICD-10-CM | POA: Diagnosis not present

## 2019-11-15 DIAGNOSIS — E1140 Type 2 diabetes mellitus with diabetic neuropathy, unspecified: Secondary | ICD-10-CM | POA: Diagnosis not present

## 2019-11-18 DIAGNOSIS — E1140 Type 2 diabetes mellitus with diabetic neuropathy, unspecified: Secondary | ICD-10-CM | POA: Diagnosis not present

## 2019-11-18 DIAGNOSIS — F0280 Dementia in other diseases classified elsewhere without behavioral disturbance: Secondary | ICD-10-CM | POA: Diagnosis not present

## 2019-11-18 DIAGNOSIS — G309 Alzheimer's disease, unspecified: Secondary | ICD-10-CM | POA: Diagnosis not present

## 2019-11-18 DIAGNOSIS — S42212D Unspecified displaced fracture of surgical neck of left humerus, subsequent encounter for fracture with routine healing: Secondary | ICD-10-CM | POA: Diagnosis not present

## 2019-11-18 DIAGNOSIS — I10 Essential (primary) hypertension: Secondary | ICD-10-CM | POA: Diagnosis not present

## 2019-11-18 DIAGNOSIS — W19XXXD Unspecified fall, subsequent encounter: Secondary | ICD-10-CM | POA: Diagnosis not present

## 2019-11-19 DIAGNOSIS — E1140 Type 2 diabetes mellitus with diabetic neuropathy, unspecified: Secondary | ICD-10-CM | POA: Diagnosis not present

## 2019-11-19 DIAGNOSIS — G309 Alzheimer's disease, unspecified: Secondary | ICD-10-CM | POA: Diagnosis not present

## 2019-11-19 DIAGNOSIS — I10 Essential (primary) hypertension: Secondary | ICD-10-CM | POA: Diagnosis not present

## 2019-11-19 DIAGNOSIS — S42212D Unspecified displaced fracture of surgical neck of left humerus, subsequent encounter for fracture with routine healing: Secondary | ICD-10-CM | POA: Diagnosis not present

## 2019-11-19 DIAGNOSIS — W19XXXD Unspecified fall, subsequent encounter: Secondary | ICD-10-CM | POA: Diagnosis not present

## 2019-11-19 DIAGNOSIS — F0280 Dementia in other diseases classified elsewhere without behavioral disturbance: Secondary | ICD-10-CM | POA: Diagnosis not present

## 2019-11-20 DIAGNOSIS — W19XXXD Unspecified fall, subsequent encounter: Secondary | ICD-10-CM | POA: Diagnosis not present

## 2019-11-20 DIAGNOSIS — G309 Alzheimer's disease, unspecified: Secondary | ICD-10-CM | POA: Diagnosis not present

## 2019-11-20 DIAGNOSIS — S42212D Unspecified displaced fracture of surgical neck of left humerus, subsequent encounter for fracture with routine healing: Secondary | ICD-10-CM | POA: Diagnosis not present

## 2019-11-20 DIAGNOSIS — I10 Essential (primary) hypertension: Secondary | ICD-10-CM | POA: Diagnosis not present

## 2019-11-20 DIAGNOSIS — E1140 Type 2 diabetes mellitus with diabetic neuropathy, unspecified: Secondary | ICD-10-CM | POA: Diagnosis not present

## 2019-11-20 DIAGNOSIS — F0280 Dementia in other diseases classified elsewhere without behavioral disturbance: Secondary | ICD-10-CM | POA: Diagnosis not present

## 2019-11-24 DIAGNOSIS — F0280 Dementia in other diseases classified elsewhere without behavioral disturbance: Secondary | ICD-10-CM | POA: Diagnosis not present

## 2019-11-24 DIAGNOSIS — G309 Alzheimer's disease, unspecified: Secondary | ICD-10-CM | POA: Diagnosis not present

## 2019-11-24 DIAGNOSIS — I10 Essential (primary) hypertension: Secondary | ICD-10-CM | POA: Diagnosis not present

## 2019-11-24 DIAGNOSIS — W19XXXD Unspecified fall, subsequent encounter: Secondary | ICD-10-CM | POA: Diagnosis not present

## 2019-11-24 DIAGNOSIS — S42212D Unspecified displaced fracture of surgical neck of left humerus, subsequent encounter for fracture with routine healing: Secondary | ICD-10-CM | POA: Diagnosis not present

## 2019-11-24 DIAGNOSIS — E1140 Type 2 diabetes mellitus with diabetic neuropathy, unspecified: Secondary | ICD-10-CM | POA: Diagnosis not present

## 2019-11-25 DIAGNOSIS — F0280 Dementia in other diseases classified elsewhere without behavioral disturbance: Secondary | ICD-10-CM | POA: Diagnosis not present

## 2019-11-25 DIAGNOSIS — S42212D Unspecified displaced fracture of surgical neck of left humerus, subsequent encounter for fracture with routine healing: Secondary | ICD-10-CM | POA: Diagnosis not present

## 2019-11-25 DIAGNOSIS — W19XXXD Unspecified fall, subsequent encounter: Secondary | ICD-10-CM | POA: Diagnosis not present

## 2019-11-25 DIAGNOSIS — G309 Alzheimer's disease, unspecified: Secondary | ICD-10-CM | POA: Diagnosis not present

## 2019-11-25 DIAGNOSIS — I10 Essential (primary) hypertension: Secondary | ICD-10-CM | POA: Diagnosis not present

## 2019-11-25 DIAGNOSIS — E1140 Type 2 diabetes mellitus with diabetic neuropathy, unspecified: Secondary | ICD-10-CM | POA: Diagnosis not present

## 2019-11-26 DIAGNOSIS — I10 Essential (primary) hypertension: Secondary | ICD-10-CM | POA: Diagnosis not present

## 2019-11-26 DIAGNOSIS — G309 Alzheimer's disease, unspecified: Secondary | ICD-10-CM | POA: Diagnosis not present

## 2019-11-26 DIAGNOSIS — F0280 Dementia in other diseases classified elsewhere without behavioral disturbance: Secondary | ICD-10-CM | POA: Diagnosis not present

## 2019-11-26 DIAGNOSIS — E1140 Type 2 diabetes mellitus with diabetic neuropathy, unspecified: Secondary | ICD-10-CM | POA: Diagnosis not present

## 2019-11-26 DIAGNOSIS — S42212D Unspecified displaced fracture of surgical neck of left humerus, subsequent encounter for fracture with routine healing: Secondary | ICD-10-CM | POA: Diagnosis not present

## 2019-11-26 DIAGNOSIS — W19XXXD Unspecified fall, subsequent encounter: Secondary | ICD-10-CM | POA: Diagnosis not present

## 2019-11-27 DIAGNOSIS — G309 Alzheimer's disease, unspecified: Secondary | ICD-10-CM | POA: Diagnosis not present

## 2019-11-27 DIAGNOSIS — S42212D Unspecified displaced fracture of surgical neck of left humerus, subsequent encounter for fracture with routine healing: Secondary | ICD-10-CM | POA: Diagnosis not present

## 2019-11-27 DIAGNOSIS — F0281 Dementia in other diseases classified elsewhere with behavioral disturbance: Secondary | ICD-10-CM | POA: Diagnosis not present

## 2019-11-27 DIAGNOSIS — W19XXXD Unspecified fall, subsequent encounter: Secondary | ICD-10-CM | POA: Diagnosis not present

## 2019-11-27 DIAGNOSIS — L89890 Pressure ulcer of other site, unstageable: Secondary | ICD-10-CM | POA: Diagnosis not present

## 2019-11-27 DIAGNOSIS — F0280 Dementia in other diseases classified elsewhere without behavioral disturbance: Secondary | ICD-10-CM | POA: Diagnosis not present

## 2019-11-27 DIAGNOSIS — I10 Essential (primary) hypertension: Secondary | ICD-10-CM | POA: Diagnosis not present

## 2019-11-27 DIAGNOSIS — I352 Nonrheumatic aortic (valve) stenosis with insufficiency: Secondary | ICD-10-CM | POA: Diagnosis not present

## 2019-11-27 DIAGNOSIS — E1140 Type 2 diabetes mellitus with diabetic neuropathy, unspecified: Secondary | ICD-10-CM | POA: Diagnosis not present

## 2019-11-27 DIAGNOSIS — E1169 Type 2 diabetes mellitus with other specified complication: Secondary | ICD-10-CM | POA: Diagnosis not present

## 2019-11-27 DIAGNOSIS — Z0000 Encounter for general adult medical examination without abnormal findings: Secondary | ICD-10-CM | POA: Diagnosis not present

## 2019-11-27 DIAGNOSIS — I872 Venous insufficiency (chronic) (peripheral): Secondary | ICD-10-CM | POA: Diagnosis not present

## 2019-11-27 DIAGNOSIS — E7849 Other hyperlipidemia: Secondary | ICD-10-CM | POA: Diagnosis not present

## 2019-11-27 DIAGNOSIS — L8992 Pressure ulcer of unspecified site, stage 2: Secondary | ICD-10-CM | POA: Diagnosis not present

## 2019-11-27 DIAGNOSIS — Z1331 Encounter for screening for depression: Secondary | ICD-10-CM | POA: Diagnosis not present

## 2019-11-28 DIAGNOSIS — E1140 Type 2 diabetes mellitus with diabetic neuropathy, unspecified: Secondary | ICD-10-CM | POA: Diagnosis not present

## 2019-11-28 DIAGNOSIS — G309 Alzheimer's disease, unspecified: Secondary | ICD-10-CM | POA: Diagnosis not present

## 2019-11-28 DIAGNOSIS — I10 Essential (primary) hypertension: Secondary | ICD-10-CM | POA: Diagnosis not present

## 2019-11-28 DIAGNOSIS — W19XXXD Unspecified fall, subsequent encounter: Secondary | ICD-10-CM | POA: Diagnosis not present

## 2019-11-28 DIAGNOSIS — F0280 Dementia in other diseases classified elsewhere without behavioral disturbance: Secondary | ICD-10-CM | POA: Diagnosis not present

## 2019-11-28 DIAGNOSIS — S42212D Unspecified displaced fracture of surgical neck of left humerus, subsequent encounter for fracture with routine healing: Secondary | ICD-10-CM | POA: Diagnosis not present

## 2019-12-01 DIAGNOSIS — I10 Essential (primary) hypertension: Secondary | ICD-10-CM | POA: Diagnosis not present

## 2019-12-01 DIAGNOSIS — S42212D Unspecified displaced fracture of surgical neck of left humerus, subsequent encounter for fracture with routine healing: Secondary | ICD-10-CM | POA: Diagnosis not present

## 2019-12-01 DIAGNOSIS — G309 Alzheimer's disease, unspecified: Secondary | ICD-10-CM | POA: Diagnosis not present

## 2019-12-01 DIAGNOSIS — E1140 Type 2 diabetes mellitus with diabetic neuropathy, unspecified: Secondary | ICD-10-CM | POA: Diagnosis not present

## 2019-12-01 DIAGNOSIS — W19XXXD Unspecified fall, subsequent encounter: Secondary | ICD-10-CM | POA: Diagnosis not present

## 2019-12-01 DIAGNOSIS — F0280 Dementia in other diseases classified elsewhere without behavioral disturbance: Secondary | ICD-10-CM | POA: Diagnosis not present

## 2019-12-02 DIAGNOSIS — I10 Essential (primary) hypertension: Secondary | ICD-10-CM | POA: Diagnosis not present

## 2019-12-02 DIAGNOSIS — F0280 Dementia in other diseases classified elsewhere without behavioral disturbance: Secondary | ICD-10-CM | POA: Diagnosis not present

## 2019-12-02 DIAGNOSIS — G309 Alzheimer's disease, unspecified: Secondary | ICD-10-CM | POA: Diagnosis not present

## 2019-12-02 DIAGNOSIS — W19XXXD Unspecified fall, subsequent encounter: Secondary | ICD-10-CM | POA: Diagnosis not present

## 2019-12-02 DIAGNOSIS — E1140 Type 2 diabetes mellitus with diabetic neuropathy, unspecified: Secondary | ICD-10-CM | POA: Diagnosis not present

## 2019-12-02 DIAGNOSIS — S42212D Unspecified displaced fracture of surgical neck of left humerus, subsequent encounter for fracture with routine healing: Secondary | ICD-10-CM | POA: Diagnosis not present

## 2019-12-03 DIAGNOSIS — F0281 Dementia in other diseases classified elsewhere with behavioral disturbance: Secondary | ICD-10-CM | POA: Diagnosis not present

## 2019-12-03 DIAGNOSIS — Z515 Encounter for palliative care: Secondary | ICD-10-CM | POA: Diagnosis not present

## 2019-12-03 DIAGNOSIS — G309 Alzheimer's disease, unspecified: Secondary | ICD-10-CM | POA: Diagnosis not present

## 2019-12-03 DIAGNOSIS — S42212D Unspecified displaced fracture of surgical neck of left humerus, subsequent encounter for fracture with routine healing: Secondary | ICD-10-CM | POA: Diagnosis not present

## 2019-12-03 DIAGNOSIS — I10 Essential (primary) hypertension: Secondary | ICD-10-CM | POA: Diagnosis not present

## 2019-12-03 DIAGNOSIS — G301 Alzheimer's disease with late onset: Secondary | ICD-10-CM | POA: Diagnosis not present

## 2019-12-03 DIAGNOSIS — W19XXXD Unspecified fall, subsequent encounter: Secondary | ICD-10-CM | POA: Diagnosis not present

## 2019-12-03 DIAGNOSIS — E1140 Type 2 diabetes mellitus with diabetic neuropathy, unspecified: Secondary | ICD-10-CM | POA: Diagnosis not present

## 2019-12-03 DIAGNOSIS — F0280 Dementia in other diseases classified elsewhere without behavioral disturbance: Secondary | ICD-10-CM | POA: Diagnosis not present

## 2019-12-09 DIAGNOSIS — G309 Alzheimer's disease, unspecified: Secondary | ICD-10-CM | POA: Diagnosis not present

## 2019-12-09 DIAGNOSIS — F0280 Dementia in other diseases classified elsewhere without behavioral disturbance: Secondary | ICD-10-CM | POA: Diagnosis not present

## 2019-12-09 DIAGNOSIS — I10 Essential (primary) hypertension: Secondary | ICD-10-CM | POA: Diagnosis not present

## 2019-12-09 DIAGNOSIS — S42212D Unspecified displaced fracture of surgical neck of left humerus, subsequent encounter for fracture with routine healing: Secondary | ICD-10-CM | POA: Diagnosis not present

## 2019-12-09 DIAGNOSIS — E1140 Type 2 diabetes mellitus with diabetic neuropathy, unspecified: Secondary | ICD-10-CM | POA: Diagnosis not present

## 2019-12-09 DIAGNOSIS — W19XXXD Unspecified fall, subsequent encounter: Secondary | ICD-10-CM | POA: Diagnosis not present

## 2019-12-10 DIAGNOSIS — W19XXXD Unspecified fall, subsequent encounter: Secondary | ICD-10-CM | POA: Diagnosis not present

## 2019-12-10 DIAGNOSIS — I10 Essential (primary) hypertension: Secondary | ICD-10-CM | POA: Diagnosis not present

## 2019-12-10 DIAGNOSIS — G309 Alzheimer's disease, unspecified: Secondary | ICD-10-CM | POA: Diagnosis not present

## 2019-12-10 DIAGNOSIS — F0280 Dementia in other diseases classified elsewhere without behavioral disturbance: Secondary | ICD-10-CM | POA: Diagnosis not present

## 2019-12-10 DIAGNOSIS — E1140 Type 2 diabetes mellitus with diabetic neuropathy, unspecified: Secondary | ICD-10-CM | POA: Diagnosis not present

## 2019-12-10 DIAGNOSIS — S42212D Unspecified displaced fracture of surgical neck of left humerus, subsequent encounter for fracture with routine healing: Secondary | ICD-10-CM | POA: Diagnosis not present

## 2019-12-11 DIAGNOSIS — I10 Essential (primary) hypertension: Secondary | ICD-10-CM | POA: Diagnosis not present

## 2019-12-11 DIAGNOSIS — E1140 Type 2 diabetes mellitus with diabetic neuropathy, unspecified: Secondary | ICD-10-CM | POA: Diagnosis not present

## 2019-12-11 DIAGNOSIS — S42212D Unspecified displaced fracture of surgical neck of left humerus, subsequent encounter for fracture with routine healing: Secondary | ICD-10-CM | POA: Diagnosis not present

## 2019-12-11 DIAGNOSIS — F0280 Dementia in other diseases classified elsewhere without behavioral disturbance: Secondary | ICD-10-CM | POA: Diagnosis not present

## 2019-12-11 DIAGNOSIS — G309 Alzheimer's disease, unspecified: Secondary | ICD-10-CM | POA: Diagnosis not present

## 2019-12-11 DIAGNOSIS — W19XXXD Unspecified fall, subsequent encounter: Secondary | ICD-10-CM | POA: Diagnosis not present

## 2019-12-12 DIAGNOSIS — I10 Essential (primary) hypertension: Secondary | ICD-10-CM | POA: Diagnosis not present

## 2019-12-12 DIAGNOSIS — F0280 Dementia in other diseases classified elsewhere without behavioral disturbance: Secondary | ICD-10-CM | POA: Diagnosis not present

## 2019-12-12 DIAGNOSIS — W19XXXD Unspecified fall, subsequent encounter: Secondary | ICD-10-CM | POA: Diagnosis not present

## 2019-12-12 DIAGNOSIS — G309 Alzheimer's disease, unspecified: Secondary | ICD-10-CM | POA: Diagnosis not present

## 2019-12-12 DIAGNOSIS — S42212D Unspecified displaced fracture of surgical neck of left humerus, subsequent encounter for fracture with routine healing: Secondary | ICD-10-CM | POA: Diagnosis not present

## 2019-12-12 DIAGNOSIS — E1140 Type 2 diabetes mellitus with diabetic neuropathy, unspecified: Secondary | ICD-10-CM | POA: Diagnosis not present

## 2019-12-15 DIAGNOSIS — I10 Essential (primary) hypertension: Secondary | ICD-10-CM | POA: Diagnosis not present

## 2019-12-15 DIAGNOSIS — S42212D Unspecified displaced fracture of surgical neck of left humerus, subsequent encounter for fracture with routine healing: Secondary | ICD-10-CM | POA: Diagnosis not present

## 2019-12-15 DIAGNOSIS — F0280 Dementia in other diseases classified elsewhere without behavioral disturbance: Secondary | ICD-10-CM | POA: Diagnosis not present

## 2019-12-15 DIAGNOSIS — Z79899 Other long term (current) drug therapy: Secondary | ICD-10-CM | POA: Diagnosis not present

## 2019-12-15 DIAGNOSIS — E1140 Type 2 diabetes mellitus with diabetic neuropathy, unspecified: Secondary | ICD-10-CM | POA: Diagnosis not present

## 2019-12-15 DIAGNOSIS — G309 Alzheimer's disease, unspecified: Secondary | ICD-10-CM | POA: Diagnosis not present

## 2019-12-15 DIAGNOSIS — W19XXXD Unspecified fall, subsequent encounter: Secondary | ICD-10-CM | POA: Diagnosis not present

## 2019-12-15 DIAGNOSIS — Z794 Long term (current) use of insulin: Secondary | ICD-10-CM | POA: Diagnosis not present

## 2019-12-15 DIAGNOSIS — L89152 Pressure ulcer of sacral region, stage 2: Secondary | ICD-10-CM | POA: Diagnosis not present

## 2019-12-15 DIAGNOSIS — E079 Disorder of thyroid, unspecified: Secondary | ICD-10-CM | POA: Diagnosis not present

## 2019-12-15 DIAGNOSIS — Z9181 History of falling: Secondary | ICD-10-CM | POA: Diagnosis not present

## 2019-12-17 DIAGNOSIS — I10 Essential (primary) hypertension: Secondary | ICD-10-CM | POA: Diagnosis not present

## 2019-12-17 DIAGNOSIS — F0280 Dementia in other diseases classified elsewhere without behavioral disturbance: Secondary | ICD-10-CM | POA: Diagnosis not present

## 2019-12-17 DIAGNOSIS — S42212D Unspecified displaced fracture of surgical neck of left humerus, subsequent encounter for fracture with routine healing: Secondary | ICD-10-CM | POA: Diagnosis not present

## 2019-12-17 DIAGNOSIS — E1140 Type 2 diabetes mellitus with diabetic neuropathy, unspecified: Secondary | ICD-10-CM | POA: Diagnosis not present

## 2019-12-17 DIAGNOSIS — L89152 Pressure ulcer of sacral region, stage 2: Secondary | ICD-10-CM | POA: Diagnosis not present

## 2019-12-17 DIAGNOSIS — G309 Alzheimer's disease, unspecified: Secondary | ICD-10-CM | POA: Diagnosis not present

## 2019-12-18 DIAGNOSIS — S42212D Unspecified displaced fracture of surgical neck of left humerus, subsequent encounter for fracture with routine healing: Secondary | ICD-10-CM | POA: Diagnosis not present

## 2019-12-18 DIAGNOSIS — L89152 Pressure ulcer of sacral region, stage 2: Secondary | ICD-10-CM | POA: Diagnosis not present

## 2019-12-18 DIAGNOSIS — I10 Essential (primary) hypertension: Secondary | ICD-10-CM | POA: Diagnosis not present

## 2019-12-18 DIAGNOSIS — F0280 Dementia in other diseases classified elsewhere without behavioral disturbance: Secondary | ICD-10-CM | POA: Diagnosis not present

## 2019-12-18 DIAGNOSIS — G309 Alzheimer's disease, unspecified: Secondary | ICD-10-CM | POA: Diagnosis not present

## 2019-12-18 DIAGNOSIS — E1140 Type 2 diabetes mellitus with diabetic neuropathy, unspecified: Secondary | ICD-10-CM | POA: Diagnosis not present

## 2019-12-19 DIAGNOSIS — F0280 Dementia in other diseases classified elsewhere without behavioral disturbance: Secondary | ICD-10-CM | POA: Diagnosis not present

## 2019-12-19 DIAGNOSIS — S42212D Unspecified displaced fracture of surgical neck of left humerus, subsequent encounter for fracture with routine healing: Secondary | ICD-10-CM | POA: Diagnosis not present

## 2019-12-19 DIAGNOSIS — I10 Essential (primary) hypertension: Secondary | ICD-10-CM | POA: Diagnosis not present

## 2019-12-19 DIAGNOSIS — G309 Alzheimer's disease, unspecified: Secondary | ICD-10-CM | POA: Diagnosis not present

## 2019-12-19 DIAGNOSIS — L89152 Pressure ulcer of sacral region, stage 2: Secondary | ICD-10-CM | POA: Diagnosis not present

## 2019-12-19 DIAGNOSIS — E1140 Type 2 diabetes mellitus with diabetic neuropathy, unspecified: Secondary | ICD-10-CM | POA: Diagnosis not present

## 2019-12-22 DIAGNOSIS — G309 Alzheimer's disease, unspecified: Secondary | ICD-10-CM | POA: Diagnosis not present

## 2019-12-22 DIAGNOSIS — I10 Essential (primary) hypertension: Secondary | ICD-10-CM | POA: Diagnosis not present

## 2019-12-22 DIAGNOSIS — S42212D Unspecified displaced fracture of surgical neck of left humerus, subsequent encounter for fracture with routine healing: Secondary | ICD-10-CM | POA: Diagnosis not present

## 2019-12-22 DIAGNOSIS — L89152 Pressure ulcer of sacral region, stage 2: Secondary | ICD-10-CM | POA: Diagnosis not present

## 2019-12-22 DIAGNOSIS — E1140 Type 2 diabetes mellitus with diabetic neuropathy, unspecified: Secondary | ICD-10-CM | POA: Diagnosis not present

## 2019-12-22 DIAGNOSIS — F0280 Dementia in other diseases classified elsewhere without behavioral disturbance: Secondary | ICD-10-CM | POA: Diagnosis not present

## 2019-12-23 DIAGNOSIS — L89152 Pressure ulcer of sacral region, stage 2: Secondary | ICD-10-CM | POA: Diagnosis not present

## 2019-12-23 DIAGNOSIS — E1140 Type 2 diabetes mellitus with diabetic neuropathy, unspecified: Secondary | ICD-10-CM | POA: Diagnosis not present

## 2019-12-23 DIAGNOSIS — F0280 Dementia in other diseases classified elsewhere without behavioral disturbance: Secondary | ICD-10-CM | POA: Diagnosis not present

## 2019-12-23 DIAGNOSIS — I10 Essential (primary) hypertension: Secondary | ICD-10-CM | POA: Diagnosis not present

## 2019-12-23 DIAGNOSIS — G309 Alzheimer's disease, unspecified: Secondary | ICD-10-CM | POA: Diagnosis not present

## 2019-12-23 DIAGNOSIS — S42212D Unspecified displaced fracture of surgical neck of left humerus, subsequent encounter for fracture with routine healing: Secondary | ICD-10-CM | POA: Diagnosis not present

## 2019-12-25 DIAGNOSIS — G309 Alzheimer's disease, unspecified: Secondary | ICD-10-CM | POA: Diagnosis not present

## 2019-12-25 DIAGNOSIS — S42212D Unspecified displaced fracture of surgical neck of left humerus, subsequent encounter for fracture with routine healing: Secondary | ICD-10-CM | POA: Diagnosis not present

## 2019-12-25 DIAGNOSIS — E1140 Type 2 diabetes mellitus with diabetic neuropathy, unspecified: Secondary | ICD-10-CM | POA: Diagnosis not present

## 2019-12-25 DIAGNOSIS — L89152 Pressure ulcer of sacral region, stage 2: Secondary | ICD-10-CM | POA: Diagnosis not present

## 2019-12-25 DIAGNOSIS — I10 Essential (primary) hypertension: Secondary | ICD-10-CM | POA: Diagnosis not present

## 2019-12-25 DIAGNOSIS — F0280 Dementia in other diseases classified elsewhere without behavioral disturbance: Secondary | ICD-10-CM | POA: Diagnosis not present

## 2019-12-29 DIAGNOSIS — G309 Alzheimer's disease, unspecified: Secondary | ICD-10-CM | POA: Diagnosis not present

## 2019-12-29 DIAGNOSIS — F0280 Dementia in other diseases classified elsewhere without behavioral disturbance: Secondary | ICD-10-CM | POA: Diagnosis not present

## 2019-12-29 DIAGNOSIS — E1140 Type 2 diabetes mellitus with diabetic neuropathy, unspecified: Secondary | ICD-10-CM | POA: Diagnosis not present

## 2019-12-29 DIAGNOSIS — L89152 Pressure ulcer of sacral region, stage 2: Secondary | ICD-10-CM | POA: Diagnosis not present

## 2019-12-29 DIAGNOSIS — I10 Essential (primary) hypertension: Secondary | ICD-10-CM | POA: Diagnosis not present

## 2019-12-29 DIAGNOSIS — S42212D Unspecified displaced fracture of surgical neck of left humerus, subsequent encounter for fracture with routine healing: Secondary | ICD-10-CM | POA: Diagnosis not present

## 2019-12-30 DIAGNOSIS — E1140 Type 2 diabetes mellitus with diabetic neuropathy, unspecified: Secondary | ICD-10-CM | POA: Diagnosis not present

## 2019-12-30 DIAGNOSIS — S42212D Unspecified displaced fracture of surgical neck of left humerus, subsequent encounter for fracture with routine healing: Secondary | ICD-10-CM | POA: Diagnosis not present

## 2019-12-30 DIAGNOSIS — F0280 Dementia in other diseases classified elsewhere without behavioral disturbance: Secondary | ICD-10-CM | POA: Diagnosis not present

## 2019-12-30 DIAGNOSIS — I10 Essential (primary) hypertension: Secondary | ICD-10-CM | POA: Diagnosis not present

## 2019-12-30 DIAGNOSIS — G309 Alzheimer's disease, unspecified: Secondary | ICD-10-CM | POA: Diagnosis not present

## 2019-12-30 DIAGNOSIS — L89152 Pressure ulcer of sacral region, stage 2: Secondary | ICD-10-CM | POA: Diagnosis not present

## 2019-12-31 DIAGNOSIS — I10 Essential (primary) hypertension: Secondary | ICD-10-CM | POA: Diagnosis not present

## 2019-12-31 DIAGNOSIS — L89152 Pressure ulcer of sacral region, stage 2: Secondary | ICD-10-CM | POA: Diagnosis not present

## 2019-12-31 DIAGNOSIS — S42212D Unspecified displaced fracture of surgical neck of left humerus, subsequent encounter for fracture with routine healing: Secondary | ICD-10-CM | POA: Diagnosis not present

## 2019-12-31 DIAGNOSIS — G309 Alzheimer's disease, unspecified: Secondary | ICD-10-CM | POA: Diagnosis not present

## 2019-12-31 DIAGNOSIS — E1140 Type 2 diabetes mellitus with diabetic neuropathy, unspecified: Secondary | ICD-10-CM | POA: Diagnosis not present

## 2019-12-31 DIAGNOSIS — F0280 Dementia in other diseases classified elsewhere without behavioral disturbance: Secondary | ICD-10-CM | POA: Diagnosis not present

## 2020-01-01 DIAGNOSIS — F0280 Dementia in other diseases classified elsewhere without behavioral disturbance: Secondary | ICD-10-CM | POA: Diagnosis not present

## 2020-01-01 DIAGNOSIS — L89152 Pressure ulcer of sacral region, stage 2: Secondary | ICD-10-CM | POA: Diagnosis not present

## 2020-01-01 DIAGNOSIS — I10 Essential (primary) hypertension: Secondary | ICD-10-CM | POA: Diagnosis not present

## 2020-01-01 DIAGNOSIS — S42212D Unspecified displaced fracture of surgical neck of left humerus, subsequent encounter for fracture with routine healing: Secondary | ICD-10-CM | POA: Diagnosis not present

## 2020-01-01 DIAGNOSIS — E1140 Type 2 diabetes mellitus with diabetic neuropathy, unspecified: Secondary | ICD-10-CM | POA: Diagnosis not present

## 2020-01-01 DIAGNOSIS — G309 Alzheimer's disease, unspecified: Secondary | ICD-10-CM | POA: Diagnosis not present

## 2020-01-06 DIAGNOSIS — L89152 Pressure ulcer of sacral region, stage 2: Secondary | ICD-10-CM | POA: Diagnosis not present

## 2020-01-06 DIAGNOSIS — F0280 Dementia in other diseases classified elsewhere without behavioral disturbance: Secondary | ICD-10-CM | POA: Diagnosis not present

## 2020-01-06 DIAGNOSIS — G309 Alzheimer's disease, unspecified: Secondary | ICD-10-CM | POA: Diagnosis not present

## 2020-01-06 DIAGNOSIS — I10 Essential (primary) hypertension: Secondary | ICD-10-CM | POA: Diagnosis not present

## 2020-01-06 DIAGNOSIS — S42212D Unspecified displaced fracture of surgical neck of left humerus, subsequent encounter for fracture with routine healing: Secondary | ICD-10-CM | POA: Diagnosis not present

## 2020-01-06 DIAGNOSIS — E1140 Type 2 diabetes mellitus with diabetic neuropathy, unspecified: Secondary | ICD-10-CM | POA: Diagnosis not present

## 2020-01-07 DIAGNOSIS — L89152 Pressure ulcer of sacral region, stage 2: Secondary | ICD-10-CM | POA: Diagnosis not present

## 2020-01-07 DIAGNOSIS — I10 Essential (primary) hypertension: Secondary | ICD-10-CM | POA: Diagnosis not present

## 2020-01-07 DIAGNOSIS — S42212D Unspecified displaced fracture of surgical neck of left humerus, subsequent encounter for fracture with routine healing: Secondary | ICD-10-CM | POA: Diagnosis not present

## 2020-01-07 DIAGNOSIS — E1140 Type 2 diabetes mellitus with diabetic neuropathy, unspecified: Secondary | ICD-10-CM | POA: Diagnosis not present

## 2020-01-07 DIAGNOSIS — F0280 Dementia in other diseases classified elsewhere without behavioral disturbance: Secondary | ICD-10-CM | POA: Diagnosis not present

## 2020-01-07 DIAGNOSIS — G309 Alzheimer's disease, unspecified: Secondary | ICD-10-CM | POA: Diagnosis not present

## 2020-01-08 DIAGNOSIS — S42212D Unspecified displaced fracture of surgical neck of left humerus, subsequent encounter for fracture with routine healing: Secondary | ICD-10-CM | POA: Diagnosis not present

## 2020-01-08 DIAGNOSIS — E1140 Type 2 diabetes mellitus with diabetic neuropathy, unspecified: Secondary | ICD-10-CM | POA: Diagnosis not present

## 2020-01-08 DIAGNOSIS — F0280 Dementia in other diseases classified elsewhere without behavioral disturbance: Secondary | ICD-10-CM | POA: Diagnosis not present

## 2020-01-08 DIAGNOSIS — G309 Alzheimer's disease, unspecified: Secondary | ICD-10-CM | POA: Diagnosis not present

## 2020-01-08 DIAGNOSIS — L89152 Pressure ulcer of sacral region, stage 2: Secondary | ICD-10-CM | POA: Diagnosis not present

## 2020-01-08 DIAGNOSIS — I10 Essential (primary) hypertension: Secondary | ICD-10-CM | POA: Diagnosis not present

## 2020-01-08 DIAGNOSIS — M19012 Primary osteoarthritis, left shoulder: Secondary | ICD-10-CM | POA: Diagnosis not present

## 2020-01-13 DIAGNOSIS — I10 Essential (primary) hypertension: Secondary | ICD-10-CM | POA: Diagnosis not present

## 2020-01-13 DIAGNOSIS — E1140 Type 2 diabetes mellitus with diabetic neuropathy, unspecified: Secondary | ICD-10-CM | POA: Diagnosis not present

## 2020-01-13 DIAGNOSIS — G309 Alzheimer's disease, unspecified: Secondary | ICD-10-CM | POA: Diagnosis not present

## 2020-01-13 DIAGNOSIS — F0280 Dementia in other diseases classified elsewhere without behavioral disturbance: Secondary | ICD-10-CM | POA: Diagnosis not present

## 2020-01-13 DIAGNOSIS — L89152 Pressure ulcer of sacral region, stage 2: Secondary | ICD-10-CM | POA: Diagnosis not present

## 2020-01-13 DIAGNOSIS — S42212D Unspecified displaced fracture of surgical neck of left humerus, subsequent encounter for fracture with routine healing: Secondary | ICD-10-CM | POA: Diagnosis not present

## 2020-01-14 DIAGNOSIS — G301 Alzheimer's disease with late onset: Secondary | ICD-10-CM | POA: Diagnosis not present

## 2020-01-14 DIAGNOSIS — F0281 Dementia in other diseases classified elsewhere with behavioral disturbance: Secondary | ICD-10-CM | POA: Diagnosis not present

## 2020-01-14 DIAGNOSIS — Z515 Encounter for palliative care: Secondary | ICD-10-CM | POA: Diagnosis not present

## 2020-02-03 DIAGNOSIS — I352 Nonrheumatic aortic (valve) stenosis with insufficiency: Secondary | ICD-10-CM | POA: Diagnosis not present

## 2020-02-03 DIAGNOSIS — I10 Essential (primary) hypertension: Secondary | ICD-10-CM | POA: Diagnosis not present

## 2020-02-03 DIAGNOSIS — Z794 Long term (current) use of insulin: Secondary | ICD-10-CM | POA: Diagnosis not present

## 2020-02-03 DIAGNOSIS — E1121 Type 2 diabetes mellitus with diabetic nephropathy: Secondary | ICD-10-CM | POA: Diagnosis not present

## 2020-02-03 DIAGNOSIS — E1169 Type 2 diabetes mellitus with other specified complication: Secondary | ICD-10-CM | POA: Diagnosis not present

## 2020-02-03 DIAGNOSIS — N390 Urinary tract infection, site not specified: Secondary | ICD-10-CM | POA: Diagnosis not present

## 2020-02-03 DIAGNOSIS — G309 Alzheimer's disease, unspecified: Secondary | ICD-10-CM | POA: Diagnosis not present

## 2020-02-03 DIAGNOSIS — E7849 Other hyperlipidemia: Secondary | ICD-10-CM | POA: Diagnosis not present

## 2020-02-03 DIAGNOSIS — F0281 Dementia in other diseases classified elsewhere with behavioral disturbance: Secondary | ICD-10-CM | POA: Diagnosis not present

## 2020-02-03 DIAGNOSIS — Z7982 Long term (current) use of aspirin: Secondary | ICD-10-CM | POA: Diagnosis not present

## 2020-02-03 DIAGNOSIS — I872 Venous insufficiency (chronic) (peripheral): Secondary | ICD-10-CM | POA: Diagnosis not present

## 2020-02-03 DIAGNOSIS — Z7902 Long term (current) use of antithrombotics/antiplatelets: Secondary | ICD-10-CM | POA: Diagnosis not present

## 2020-02-03 DIAGNOSIS — E1151 Type 2 diabetes mellitus with diabetic peripheral angiopathy without gangrene: Secondary | ICD-10-CM | POA: Diagnosis not present

## 2020-02-06 DIAGNOSIS — F0281 Dementia in other diseases classified elsewhere with behavioral disturbance: Secondary | ICD-10-CM | POA: Diagnosis not present

## 2020-02-06 DIAGNOSIS — E1169 Type 2 diabetes mellitus with other specified complication: Secondary | ICD-10-CM | POA: Diagnosis not present

## 2020-02-06 DIAGNOSIS — E7849 Other hyperlipidemia: Secondary | ICD-10-CM | POA: Diagnosis not present

## 2020-02-06 DIAGNOSIS — I10 Essential (primary) hypertension: Secondary | ICD-10-CM | POA: Diagnosis not present

## 2020-02-06 DIAGNOSIS — G309 Alzheimer's disease, unspecified: Secondary | ICD-10-CM | POA: Diagnosis not present

## 2020-02-06 DIAGNOSIS — E1121 Type 2 diabetes mellitus with diabetic nephropathy: Secondary | ICD-10-CM | POA: Diagnosis not present

## 2020-02-12 DIAGNOSIS — E1121 Type 2 diabetes mellitus with diabetic nephropathy: Secondary | ICD-10-CM | POA: Diagnosis not present

## 2020-02-12 DIAGNOSIS — E7849 Other hyperlipidemia: Secondary | ICD-10-CM | POA: Diagnosis not present

## 2020-02-12 DIAGNOSIS — F0281 Dementia in other diseases classified elsewhere with behavioral disturbance: Secondary | ICD-10-CM | POA: Diagnosis not present

## 2020-02-12 DIAGNOSIS — G309 Alzheimer's disease, unspecified: Secondary | ICD-10-CM | POA: Diagnosis not present

## 2020-02-12 DIAGNOSIS — I10 Essential (primary) hypertension: Secondary | ICD-10-CM | POA: Diagnosis not present

## 2020-02-12 DIAGNOSIS — E1169 Type 2 diabetes mellitus with other specified complication: Secondary | ICD-10-CM | POA: Diagnosis not present

## 2020-02-19 DIAGNOSIS — E1121 Type 2 diabetes mellitus with diabetic nephropathy: Secondary | ICD-10-CM | POA: Diagnosis not present

## 2020-02-19 DIAGNOSIS — G309 Alzheimer's disease, unspecified: Secondary | ICD-10-CM | POA: Diagnosis not present

## 2020-02-19 DIAGNOSIS — E7849 Other hyperlipidemia: Secondary | ICD-10-CM | POA: Diagnosis not present

## 2020-02-19 DIAGNOSIS — I10 Essential (primary) hypertension: Secondary | ICD-10-CM | POA: Diagnosis not present

## 2020-02-19 DIAGNOSIS — F0281 Dementia in other diseases classified elsewhere with behavioral disturbance: Secondary | ICD-10-CM | POA: Diagnosis not present

## 2020-02-19 DIAGNOSIS — E1169 Type 2 diabetes mellitus with other specified complication: Secondary | ICD-10-CM | POA: Diagnosis not present

## 2020-02-21 DIAGNOSIS — F0281 Dementia in other diseases classified elsewhere with behavioral disturbance: Secondary | ICD-10-CM | POA: Diagnosis not present

## 2020-02-21 DIAGNOSIS — G309 Alzheimer's disease, unspecified: Secondary | ICD-10-CM | POA: Diagnosis not present

## 2020-02-21 DIAGNOSIS — E7849 Other hyperlipidemia: Secondary | ICD-10-CM | POA: Diagnosis not present

## 2020-02-21 DIAGNOSIS — E1121 Type 2 diabetes mellitus with diabetic nephropathy: Secondary | ICD-10-CM | POA: Diagnosis not present

## 2020-02-21 DIAGNOSIS — I10 Essential (primary) hypertension: Secondary | ICD-10-CM | POA: Diagnosis not present

## 2020-02-21 DIAGNOSIS — E1169 Type 2 diabetes mellitus with other specified complication: Secondary | ICD-10-CM | POA: Diagnosis not present

## 2020-02-24 DIAGNOSIS — E7849 Other hyperlipidemia: Secondary | ICD-10-CM | POA: Diagnosis not present

## 2020-02-24 DIAGNOSIS — E1121 Type 2 diabetes mellitus with diabetic nephropathy: Secondary | ICD-10-CM | POA: Diagnosis not present

## 2020-02-24 DIAGNOSIS — I10 Essential (primary) hypertension: Secondary | ICD-10-CM | POA: Diagnosis not present

## 2020-02-24 DIAGNOSIS — G309 Alzheimer's disease, unspecified: Secondary | ICD-10-CM | POA: Diagnosis not present

## 2020-02-24 DIAGNOSIS — E1169 Type 2 diabetes mellitus with other specified complication: Secondary | ICD-10-CM | POA: Diagnosis not present

## 2020-02-24 DIAGNOSIS — F0281 Dementia in other diseases classified elsewhere with behavioral disturbance: Secondary | ICD-10-CM | POA: Diagnosis not present

## 2020-02-25 DIAGNOSIS — Z515 Encounter for palliative care: Secondary | ICD-10-CM | POA: Diagnosis not present

## 2020-02-25 DIAGNOSIS — G301 Alzheimer's disease with late onset: Secondary | ICD-10-CM | POA: Diagnosis not present

## 2020-02-25 DIAGNOSIS — F0281 Dementia in other diseases classified elsewhere with behavioral disturbance: Secondary | ICD-10-CM | POA: Diagnosis not present

## 2020-02-26 DIAGNOSIS — E1121 Type 2 diabetes mellitus with diabetic nephropathy: Secondary | ICD-10-CM | POA: Diagnosis not present

## 2020-02-26 DIAGNOSIS — E7849 Other hyperlipidemia: Secondary | ICD-10-CM | POA: Diagnosis not present

## 2020-02-26 DIAGNOSIS — Z0000 Encounter for general adult medical examination without abnormal findings: Secondary | ICD-10-CM | POA: Diagnosis not present

## 2020-02-26 DIAGNOSIS — I10 Essential (primary) hypertension: Secondary | ICD-10-CM | POA: Diagnosis not present

## 2020-02-26 DIAGNOSIS — L8992 Pressure ulcer of unspecified site, stage 2: Secondary | ICD-10-CM | POA: Diagnosis not present

## 2020-02-26 DIAGNOSIS — L02811 Cutaneous abscess of head [any part, except face]: Secondary | ICD-10-CM | POA: Diagnosis not present

## 2020-02-26 DIAGNOSIS — I872 Venous insufficiency (chronic) (peripheral): Secondary | ICD-10-CM | POA: Diagnosis not present

## 2020-02-26 DIAGNOSIS — E1169 Type 2 diabetes mellitus with other specified complication: Secondary | ICD-10-CM | POA: Diagnosis not present

## 2020-02-26 DIAGNOSIS — G309 Alzheimer's disease, unspecified: Secondary | ICD-10-CM | POA: Diagnosis not present

## 2020-02-26 DIAGNOSIS — I352 Nonrheumatic aortic (valve) stenosis with insufficiency: Secondary | ICD-10-CM | POA: Diagnosis not present

## 2020-02-26 DIAGNOSIS — F0281 Dementia in other diseases classified elsewhere with behavioral disturbance: Secondary | ICD-10-CM | POA: Diagnosis not present

## 2020-02-26 DIAGNOSIS — Z1331 Encounter for screening for depression: Secondary | ICD-10-CM | POA: Diagnosis not present

## 2020-03-01 DIAGNOSIS — E1121 Type 2 diabetes mellitus with diabetic nephropathy: Secondary | ICD-10-CM | POA: Diagnosis not present

## 2020-03-01 DIAGNOSIS — E1169 Type 2 diabetes mellitus with other specified complication: Secondary | ICD-10-CM | POA: Diagnosis not present

## 2020-03-01 DIAGNOSIS — F0281 Dementia in other diseases classified elsewhere with behavioral disturbance: Secondary | ICD-10-CM | POA: Diagnosis not present

## 2020-03-01 DIAGNOSIS — G309 Alzheimer's disease, unspecified: Secondary | ICD-10-CM | POA: Diagnosis not present

## 2020-03-01 DIAGNOSIS — I10 Essential (primary) hypertension: Secondary | ICD-10-CM | POA: Diagnosis not present

## 2020-03-01 DIAGNOSIS — E7849 Other hyperlipidemia: Secondary | ICD-10-CM | POA: Diagnosis not present

## 2020-03-03 DIAGNOSIS — E1169 Type 2 diabetes mellitus with other specified complication: Secondary | ICD-10-CM | POA: Diagnosis not present

## 2020-03-03 DIAGNOSIS — E1121 Type 2 diabetes mellitus with diabetic nephropathy: Secondary | ICD-10-CM | POA: Diagnosis not present

## 2020-03-03 DIAGNOSIS — I10 Essential (primary) hypertension: Secondary | ICD-10-CM | POA: Diagnosis not present

## 2020-03-03 DIAGNOSIS — F0281 Dementia in other diseases classified elsewhere with behavioral disturbance: Secondary | ICD-10-CM | POA: Diagnosis not present

## 2020-03-03 DIAGNOSIS — G309 Alzheimer's disease, unspecified: Secondary | ICD-10-CM | POA: Diagnosis not present

## 2020-03-03 DIAGNOSIS — E7849 Other hyperlipidemia: Secondary | ICD-10-CM | POA: Diagnosis not present

## 2020-03-04 DIAGNOSIS — F0281 Dementia in other diseases classified elsewhere with behavioral disturbance: Secondary | ICD-10-CM | POA: Diagnosis not present

## 2020-03-04 DIAGNOSIS — N390 Urinary tract infection, site not specified: Secondary | ICD-10-CM | POA: Diagnosis not present

## 2020-03-04 DIAGNOSIS — Z7982 Long term (current) use of aspirin: Secondary | ICD-10-CM | POA: Diagnosis not present

## 2020-03-04 DIAGNOSIS — Z794 Long term (current) use of insulin: Secondary | ICD-10-CM | POA: Diagnosis not present

## 2020-03-04 DIAGNOSIS — E1151 Type 2 diabetes mellitus with diabetic peripheral angiopathy without gangrene: Secondary | ICD-10-CM | POA: Diagnosis not present

## 2020-03-04 DIAGNOSIS — I352 Nonrheumatic aortic (valve) stenosis with insufficiency: Secondary | ICD-10-CM | POA: Diagnosis not present

## 2020-03-04 DIAGNOSIS — E7849 Other hyperlipidemia: Secondary | ICD-10-CM | POA: Diagnosis not present

## 2020-03-04 DIAGNOSIS — E1121 Type 2 diabetes mellitus with diabetic nephropathy: Secondary | ICD-10-CM | POA: Diagnosis not present

## 2020-03-04 DIAGNOSIS — Z7902 Long term (current) use of antithrombotics/antiplatelets: Secondary | ICD-10-CM | POA: Diagnosis not present

## 2020-03-04 DIAGNOSIS — I872 Venous insufficiency (chronic) (peripheral): Secondary | ICD-10-CM | POA: Diagnosis not present

## 2020-03-04 DIAGNOSIS — G309 Alzheimer's disease, unspecified: Secondary | ICD-10-CM | POA: Diagnosis not present

## 2020-03-04 DIAGNOSIS — E1169 Type 2 diabetes mellitus with other specified complication: Secondary | ICD-10-CM | POA: Diagnosis not present

## 2020-03-04 DIAGNOSIS — R600 Localized edema: Secondary | ICD-10-CM | POA: Diagnosis not present

## 2020-03-04 DIAGNOSIS — I10 Essential (primary) hypertension: Secondary | ICD-10-CM | POA: Diagnosis not present

## 2020-03-09 DIAGNOSIS — F0281 Dementia in other diseases classified elsewhere with behavioral disturbance: Secondary | ICD-10-CM | POA: Diagnosis not present

## 2020-03-09 DIAGNOSIS — E1121 Type 2 diabetes mellitus with diabetic nephropathy: Secondary | ICD-10-CM | POA: Diagnosis not present

## 2020-03-09 DIAGNOSIS — G309 Alzheimer's disease, unspecified: Secondary | ICD-10-CM | POA: Diagnosis not present

## 2020-03-09 DIAGNOSIS — E1169 Type 2 diabetes mellitus with other specified complication: Secondary | ICD-10-CM | POA: Diagnosis not present

## 2020-03-09 DIAGNOSIS — I10 Essential (primary) hypertension: Secondary | ICD-10-CM | POA: Diagnosis not present

## 2020-03-09 DIAGNOSIS — E7849 Other hyperlipidemia: Secondary | ICD-10-CM | POA: Diagnosis not present

## 2020-03-17 DIAGNOSIS — E1121 Type 2 diabetes mellitus with diabetic nephropathy: Secondary | ICD-10-CM | POA: Diagnosis not present

## 2020-03-17 DIAGNOSIS — I10 Essential (primary) hypertension: Secondary | ICD-10-CM | POA: Diagnosis not present

## 2020-03-17 DIAGNOSIS — G309 Alzheimer's disease, unspecified: Secondary | ICD-10-CM | POA: Diagnosis not present

## 2020-03-17 DIAGNOSIS — F0281 Dementia in other diseases classified elsewhere with behavioral disturbance: Secondary | ICD-10-CM | POA: Diagnosis not present

## 2020-03-17 DIAGNOSIS — E1169 Type 2 diabetes mellitus with other specified complication: Secondary | ICD-10-CM | POA: Diagnosis not present

## 2020-03-17 DIAGNOSIS — E7849 Other hyperlipidemia: Secondary | ICD-10-CM | POA: Diagnosis not present

## 2020-03-23 DIAGNOSIS — F0281 Dementia in other diseases classified elsewhere with behavioral disturbance: Secondary | ICD-10-CM | POA: Diagnosis not present

## 2020-03-23 DIAGNOSIS — I10 Essential (primary) hypertension: Secondary | ICD-10-CM | POA: Diagnosis not present

## 2020-03-23 DIAGNOSIS — E1169 Type 2 diabetes mellitus with other specified complication: Secondary | ICD-10-CM | POA: Diagnosis not present

## 2020-03-23 DIAGNOSIS — E1121 Type 2 diabetes mellitus with diabetic nephropathy: Secondary | ICD-10-CM | POA: Diagnosis not present

## 2020-03-23 DIAGNOSIS — G309 Alzheimer's disease, unspecified: Secondary | ICD-10-CM | POA: Diagnosis not present

## 2020-03-23 DIAGNOSIS — E7849 Other hyperlipidemia: Secondary | ICD-10-CM | POA: Diagnosis not present

## 2020-03-31 DIAGNOSIS — E7849 Other hyperlipidemia: Secondary | ICD-10-CM | POA: Diagnosis not present

## 2020-03-31 DIAGNOSIS — G309 Alzheimer's disease, unspecified: Secondary | ICD-10-CM | POA: Diagnosis not present

## 2020-03-31 DIAGNOSIS — E1169 Type 2 diabetes mellitus with other specified complication: Secondary | ICD-10-CM | POA: Diagnosis not present

## 2020-03-31 DIAGNOSIS — I10 Essential (primary) hypertension: Secondary | ICD-10-CM | POA: Diagnosis not present

## 2020-03-31 DIAGNOSIS — E1121 Type 2 diabetes mellitus with diabetic nephropathy: Secondary | ICD-10-CM | POA: Diagnosis not present

## 2020-03-31 DIAGNOSIS — F0281 Dementia in other diseases classified elsewhere with behavioral disturbance: Secondary | ICD-10-CM | POA: Diagnosis not present

## 2020-04-01 DIAGNOSIS — N390 Urinary tract infection, site not specified: Secondary | ICD-10-CM | POA: Diagnosis not present

## 2020-04-01 DIAGNOSIS — E7849 Other hyperlipidemia: Secondary | ICD-10-CM | POA: Diagnosis not present

## 2020-04-01 DIAGNOSIS — G309 Alzheimer's disease, unspecified: Secondary | ICD-10-CM | POA: Diagnosis not present

## 2020-04-01 DIAGNOSIS — F0281 Dementia in other diseases classified elsewhere with behavioral disturbance: Secondary | ICD-10-CM | POA: Diagnosis not present

## 2020-04-01 DIAGNOSIS — E1169 Type 2 diabetes mellitus with other specified complication: Secondary | ICD-10-CM | POA: Diagnosis not present

## 2020-04-01 DIAGNOSIS — E1121 Type 2 diabetes mellitus with diabetic nephropathy: Secondary | ICD-10-CM | POA: Diagnosis not present

## 2020-04-01 DIAGNOSIS — I10 Essential (primary) hypertension: Secondary | ICD-10-CM | POA: Diagnosis not present

## 2020-04-03 DIAGNOSIS — E1121 Type 2 diabetes mellitus with diabetic nephropathy: Secondary | ICD-10-CM | POA: Diagnosis not present

## 2020-04-03 DIAGNOSIS — Z7902 Long term (current) use of antithrombotics/antiplatelets: Secondary | ICD-10-CM | POA: Diagnosis not present

## 2020-04-03 DIAGNOSIS — I352 Nonrheumatic aortic (valve) stenosis with insufficiency: Secondary | ICD-10-CM | POA: Diagnosis not present

## 2020-04-03 DIAGNOSIS — I10 Essential (primary) hypertension: Secondary | ICD-10-CM | POA: Diagnosis not present

## 2020-04-03 DIAGNOSIS — Z794 Long term (current) use of insulin: Secondary | ICD-10-CM | POA: Diagnosis not present

## 2020-04-03 DIAGNOSIS — E7849 Other hyperlipidemia: Secondary | ICD-10-CM | POA: Diagnosis not present

## 2020-04-03 DIAGNOSIS — E1151 Type 2 diabetes mellitus with diabetic peripheral angiopathy without gangrene: Secondary | ICD-10-CM | POA: Diagnosis not present

## 2020-04-03 DIAGNOSIS — I872 Venous insufficiency (chronic) (peripheral): Secondary | ICD-10-CM | POA: Diagnosis not present

## 2020-04-03 DIAGNOSIS — L89311 Pressure ulcer of right buttock, stage 1: Secondary | ICD-10-CM | POA: Diagnosis not present

## 2020-04-03 DIAGNOSIS — G309 Alzheimer's disease, unspecified: Secondary | ICD-10-CM | POA: Diagnosis not present

## 2020-04-03 DIAGNOSIS — E1169 Type 2 diabetes mellitus with other specified complication: Secondary | ICD-10-CM | POA: Diagnosis not present

## 2020-04-03 DIAGNOSIS — F0281 Dementia in other diseases classified elsewhere with behavioral disturbance: Secondary | ICD-10-CM | POA: Diagnosis not present

## 2020-04-03 DIAGNOSIS — N390 Urinary tract infection, site not specified: Secondary | ICD-10-CM | POA: Diagnosis not present

## 2020-04-03 DIAGNOSIS — Z7982 Long term (current) use of aspirin: Secondary | ICD-10-CM | POA: Diagnosis not present

## 2020-04-03 DIAGNOSIS — Z993 Dependence on wheelchair: Secondary | ICD-10-CM | POA: Diagnosis not present

## 2020-04-05 DIAGNOSIS — F0281 Dementia in other diseases classified elsewhere with behavioral disturbance: Secondary | ICD-10-CM | POA: Diagnosis not present

## 2020-04-05 DIAGNOSIS — N390 Urinary tract infection, site not specified: Secondary | ICD-10-CM | POA: Diagnosis not present

## 2020-04-05 DIAGNOSIS — E7849 Other hyperlipidemia: Secondary | ICD-10-CM | POA: Diagnosis not present

## 2020-04-05 DIAGNOSIS — L89311 Pressure ulcer of right buttock, stage 1: Secondary | ICD-10-CM | POA: Diagnosis not present

## 2020-04-05 DIAGNOSIS — G309 Alzheimer's disease, unspecified: Secondary | ICD-10-CM | POA: Diagnosis not present

## 2020-04-05 DIAGNOSIS — E1169 Type 2 diabetes mellitus with other specified complication: Secondary | ICD-10-CM | POA: Diagnosis not present

## 2020-04-06 DIAGNOSIS — F0281 Dementia in other diseases classified elsewhere with behavioral disturbance: Secondary | ICD-10-CM | POA: Diagnosis not present

## 2020-04-06 DIAGNOSIS — G309 Alzheimer's disease, unspecified: Secondary | ICD-10-CM | POA: Diagnosis not present

## 2020-04-06 DIAGNOSIS — E7849 Other hyperlipidemia: Secondary | ICD-10-CM | POA: Diagnosis not present

## 2020-04-06 DIAGNOSIS — L89311 Pressure ulcer of right buttock, stage 1: Secondary | ICD-10-CM | POA: Diagnosis not present

## 2020-04-06 DIAGNOSIS — N390 Urinary tract infection, site not specified: Secondary | ICD-10-CM | POA: Diagnosis not present

## 2020-04-06 DIAGNOSIS — E1169 Type 2 diabetes mellitus with other specified complication: Secondary | ICD-10-CM | POA: Diagnosis not present

## 2020-04-07 DIAGNOSIS — E7849 Other hyperlipidemia: Secondary | ICD-10-CM | POA: Diagnosis not present

## 2020-04-07 DIAGNOSIS — E1169 Type 2 diabetes mellitus with other specified complication: Secondary | ICD-10-CM | POA: Diagnosis not present

## 2020-04-07 DIAGNOSIS — G309 Alzheimer's disease, unspecified: Secondary | ICD-10-CM | POA: Diagnosis not present

## 2020-04-07 DIAGNOSIS — F0281 Dementia in other diseases classified elsewhere with behavioral disturbance: Secondary | ICD-10-CM | POA: Diagnosis not present

## 2020-04-07 DIAGNOSIS — Z515 Encounter for palliative care: Secondary | ICD-10-CM | POA: Diagnosis not present

## 2020-04-07 DIAGNOSIS — L89311 Pressure ulcer of right buttock, stage 1: Secondary | ICD-10-CM | POA: Diagnosis not present

## 2020-04-07 DIAGNOSIS — G301 Alzheimer's disease with late onset: Secondary | ICD-10-CM | POA: Diagnosis not present

## 2020-04-07 DIAGNOSIS — N390 Urinary tract infection, site not specified: Secondary | ICD-10-CM | POA: Diagnosis not present

## 2020-04-09 DIAGNOSIS — L89311 Pressure ulcer of right buttock, stage 1: Secondary | ICD-10-CM | POA: Diagnosis not present

## 2020-04-09 DIAGNOSIS — N390 Urinary tract infection, site not specified: Secondary | ICD-10-CM | POA: Diagnosis not present

## 2020-04-09 DIAGNOSIS — F0281 Dementia in other diseases classified elsewhere with behavioral disturbance: Secondary | ICD-10-CM | POA: Diagnosis not present

## 2020-04-09 DIAGNOSIS — E7849 Other hyperlipidemia: Secondary | ICD-10-CM | POA: Diagnosis not present

## 2020-04-09 DIAGNOSIS — E1169 Type 2 diabetes mellitus with other specified complication: Secondary | ICD-10-CM | POA: Diagnosis not present

## 2020-04-09 DIAGNOSIS — G309 Alzheimer's disease, unspecified: Secondary | ICD-10-CM | POA: Diagnosis not present

## 2020-04-12 DIAGNOSIS — E1169 Type 2 diabetes mellitus with other specified complication: Secondary | ICD-10-CM | POA: Diagnosis not present

## 2020-04-12 DIAGNOSIS — F0281 Dementia in other diseases classified elsewhere with behavioral disturbance: Secondary | ICD-10-CM | POA: Diagnosis not present

## 2020-04-12 DIAGNOSIS — E7849 Other hyperlipidemia: Secondary | ICD-10-CM | POA: Diagnosis not present

## 2020-04-12 DIAGNOSIS — G309 Alzheimer's disease, unspecified: Secondary | ICD-10-CM | POA: Diagnosis not present

## 2020-04-12 DIAGNOSIS — N390 Urinary tract infection, site not specified: Secondary | ICD-10-CM | POA: Diagnosis not present

## 2020-04-12 DIAGNOSIS — L89311 Pressure ulcer of right buttock, stage 1: Secondary | ICD-10-CM | POA: Diagnosis not present

## 2020-04-14 DIAGNOSIS — F0281 Dementia in other diseases classified elsewhere with behavioral disturbance: Secondary | ICD-10-CM | POA: Diagnosis not present

## 2020-04-14 DIAGNOSIS — E7849 Other hyperlipidemia: Secondary | ICD-10-CM | POA: Diagnosis not present

## 2020-04-14 DIAGNOSIS — E1169 Type 2 diabetes mellitus with other specified complication: Secondary | ICD-10-CM | POA: Diagnosis not present

## 2020-04-14 DIAGNOSIS — L89311 Pressure ulcer of right buttock, stage 1: Secondary | ICD-10-CM | POA: Diagnosis not present

## 2020-04-14 DIAGNOSIS — N390 Urinary tract infection, site not specified: Secondary | ICD-10-CM | POA: Diagnosis not present

## 2020-04-14 DIAGNOSIS — G309 Alzheimer's disease, unspecified: Secondary | ICD-10-CM | POA: Diagnosis not present

## 2020-04-16 DIAGNOSIS — E1169 Type 2 diabetes mellitus with other specified complication: Secondary | ICD-10-CM | POA: Diagnosis not present

## 2020-04-16 DIAGNOSIS — E7849 Other hyperlipidemia: Secondary | ICD-10-CM | POA: Diagnosis not present

## 2020-04-16 DIAGNOSIS — F0281 Dementia in other diseases classified elsewhere with behavioral disturbance: Secondary | ICD-10-CM | POA: Diagnosis not present

## 2020-04-16 DIAGNOSIS — L89311 Pressure ulcer of right buttock, stage 1: Secondary | ICD-10-CM | POA: Diagnosis not present

## 2020-04-16 DIAGNOSIS — N390 Urinary tract infection, site not specified: Secondary | ICD-10-CM | POA: Diagnosis not present

## 2020-04-16 DIAGNOSIS — G309 Alzheimer's disease, unspecified: Secondary | ICD-10-CM | POA: Diagnosis not present

## 2020-04-19 DIAGNOSIS — E7849 Other hyperlipidemia: Secondary | ICD-10-CM | POA: Diagnosis not present

## 2020-04-19 DIAGNOSIS — L89311 Pressure ulcer of right buttock, stage 1: Secondary | ICD-10-CM | POA: Diagnosis not present

## 2020-04-19 DIAGNOSIS — E1169 Type 2 diabetes mellitus with other specified complication: Secondary | ICD-10-CM | POA: Diagnosis not present

## 2020-04-19 DIAGNOSIS — F0281 Dementia in other diseases classified elsewhere with behavioral disturbance: Secondary | ICD-10-CM | POA: Diagnosis not present

## 2020-04-19 DIAGNOSIS — N390 Urinary tract infection, site not specified: Secondary | ICD-10-CM | POA: Diagnosis not present

## 2020-04-19 DIAGNOSIS — G309 Alzheimer's disease, unspecified: Secondary | ICD-10-CM | POA: Diagnosis not present

## 2020-04-21 DIAGNOSIS — E7849 Other hyperlipidemia: Secondary | ICD-10-CM | POA: Diagnosis not present

## 2020-04-21 DIAGNOSIS — N390 Urinary tract infection, site not specified: Secondary | ICD-10-CM | POA: Diagnosis not present

## 2020-04-21 DIAGNOSIS — L89311 Pressure ulcer of right buttock, stage 1: Secondary | ICD-10-CM | POA: Diagnosis not present

## 2020-04-21 DIAGNOSIS — G309 Alzheimer's disease, unspecified: Secondary | ICD-10-CM | POA: Diagnosis not present

## 2020-04-21 DIAGNOSIS — F0281 Dementia in other diseases classified elsewhere with behavioral disturbance: Secondary | ICD-10-CM | POA: Diagnosis not present

## 2020-04-21 DIAGNOSIS — E1169 Type 2 diabetes mellitus with other specified complication: Secondary | ICD-10-CM | POA: Diagnosis not present

## 2020-04-29 DIAGNOSIS — F0281 Dementia in other diseases classified elsewhere with behavioral disturbance: Secondary | ICD-10-CM | POA: Diagnosis not present

## 2020-04-29 DIAGNOSIS — E1169 Type 2 diabetes mellitus with other specified complication: Secondary | ICD-10-CM | POA: Diagnosis not present

## 2020-04-29 DIAGNOSIS — N390 Urinary tract infection, site not specified: Secondary | ICD-10-CM | POA: Diagnosis not present

## 2020-04-29 DIAGNOSIS — E7849 Other hyperlipidemia: Secondary | ICD-10-CM | POA: Diagnosis not present

## 2020-04-29 DIAGNOSIS — L89311 Pressure ulcer of right buttock, stage 1: Secondary | ICD-10-CM | POA: Diagnosis not present

## 2020-04-29 DIAGNOSIS — G309 Alzheimer's disease, unspecified: Secondary | ICD-10-CM | POA: Diagnosis not present

## 2020-05-18 DIAGNOSIS — Z23 Encounter for immunization: Secondary | ICD-10-CM | POA: Diagnosis not present

## 2020-05-18 DIAGNOSIS — Z515 Encounter for palliative care: Secondary | ICD-10-CM | POA: Diagnosis not present

## 2020-05-18 DIAGNOSIS — G301 Alzheimer's disease with late onset: Secondary | ICD-10-CM | POA: Diagnosis not present

## 2020-05-18 DIAGNOSIS — F0281 Dementia in other diseases classified elsewhere with behavioral disturbance: Secondary | ICD-10-CM | POA: Diagnosis not present

## 2020-05-20 DIAGNOSIS — G4700 Insomnia, unspecified: Secondary | ICD-10-CM | POA: Diagnosis not present

## 2020-05-20 DIAGNOSIS — F0280 Dementia in other diseases classified elsewhere without behavioral disturbance: Secondary | ICD-10-CM | POA: Diagnosis not present

## 2020-05-20 DIAGNOSIS — I10 Essential (primary) hypertension: Secondary | ICD-10-CM | POA: Diagnosis not present

## 2020-05-20 DIAGNOSIS — R600 Localized edema: Secondary | ICD-10-CM | POA: Diagnosis not present

## 2020-05-20 DIAGNOSIS — E039 Hypothyroidism, unspecified: Secondary | ICD-10-CM | POA: Diagnosis not present

## 2020-05-20 DIAGNOSIS — E785 Hyperlipidemia, unspecified: Secondary | ICD-10-CM | POA: Diagnosis not present

## 2020-05-20 DIAGNOSIS — G309 Alzheimer's disease, unspecified: Secondary | ICD-10-CM | POA: Diagnosis not present

## 2020-05-20 DIAGNOSIS — E1121 Type 2 diabetes mellitus with diabetic nephropathy: Secondary | ICD-10-CM | POA: Diagnosis not present

## 2020-05-21 DIAGNOSIS — E039 Hypothyroidism, unspecified: Secondary | ICD-10-CM | POA: Diagnosis not present

## 2020-05-21 DIAGNOSIS — E1121 Type 2 diabetes mellitus with diabetic nephropathy: Secondary | ICD-10-CM | POA: Diagnosis not present

## 2020-05-21 DIAGNOSIS — R1312 Dysphagia, oropharyngeal phase: Secondary | ICD-10-CM | POA: Diagnosis not present

## 2020-05-21 DIAGNOSIS — R262 Difficulty in walking, not elsewhere classified: Secondary | ICD-10-CM | POA: Diagnosis not present

## 2020-05-21 DIAGNOSIS — M6281 Muscle weakness (generalized): Secondary | ICD-10-CM | POA: Diagnosis not present

## 2020-05-21 DIAGNOSIS — E119 Type 2 diabetes mellitus without complications: Secondary | ICD-10-CM | POA: Diagnosis not present

## 2020-05-21 DIAGNOSIS — R41841 Cognitive communication deficit: Secondary | ICD-10-CM | POA: Diagnosis not present

## 2020-05-24 DIAGNOSIS — M6281 Muscle weakness (generalized): Secondary | ICD-10-CM | POA: Diagnosis not present

## 2020-05-24 DIAGNOSIS — R1312 Dysphagia, oropharyngeal phase: Secondary | ICD-10-CM | POA: Diagnosis not present

## 2020-05-24 DIAGNOSIS — E1121 Type 2 diabetes mellitus with diabetic nephropathy: Secondary | ICD-10-CM | POA: Diagnosis not present

## 2020-05-24 DIAGNOSIS — R262 Difficulty in walking, not elsewhere classified: Secondary | ICD-10-CM | POA: Diagnosis not present

## 2020-05-24 DIAGNOSIS — R41841 Cognitive communication deficit: Secondary | ICD-10-CM | POA: Diagnosis not present

## 2020-05-25 DIAGNOSIS — R41841 Cognitive communication deficit: Secondary | ICD-10-CM | POA: Diagnosis not present

## 2020-05-25 DIAGNOSIS — R262 Difficulty in walking, not elsewhere classified: Secondary | ICD-10-CM | POA: Diagnosis not present

## 2020-05-25 DIAGNOSIS — E1121 Type 2 diabetes mellitus with diabetic nephropathy: Secondary | ICD-10-CM | POA: Diagnosis not present

## 2020-05-25 DIAGNOSIS — M6281 Muscle weakness (generalized): Secondary | ICD-10-CM | POA: Diagnosis not present

## 2020-05-25 DIAGNOSIS — R1312 Dysphagia, oropharyngeal phase: Secondary | ICD-10-CM | POA: Diagnosis not present

## 2020-05-26 DIAGNOSIS — R262 Difficulty in walking, not elsewhere classified: Secondary | ICD-10-CM | POA: Diagnosis not present

## 2020-05-26 DIAGNOSIS — E1121 Type 2 diabetes mellitus with diabetic nephropathy: Secondary | ICD-10-CM | POA: Diagnosis not present

## 2020-05-26 DIAGNOSIS — M6281 Muscle weakness (generalized): Secondary | ICD-10-CM | POA: Diagnosis not present

## 2020-05-26 DIAGNOSIS — R1312 Dysphagia, oropharyngeal phase: Secondary | ICD-10-CM | POA: Diagnosis not present

## 2020-05-26 DIAGNOSIS — R41841 Cognitive communication deficit: Secondary | ICD-10-CM | POA: Diagnosis not present

## 2020-05-27 DIAGNOSIS — R1312 Dysphagia, oropharyngeal phase: Secondary | ICD-10-CM | POA: Diagnosis not present

## 2020-05-27 DIAGNOSIS — E1121 Type 2 diabetes mellitus with diabetic nephropathy: Secondary | ICD-10-CM | POA: Diagnosis not present

## 2020-05-27 DIAGNOSIS — R41841 Cognitive communication deficit: Secondary | ICD-10-CM | POA: Diagnosis not present

## 2020-05-27 DIAGNOSIS — R262 Difficulty in walking, not elsewhere classified: Secondary | ICD-10-CM | POA: Diagnosis not present

## 2020-05-27 DIAGNOSIS — M6281 Muscle weakness (generalized): Secondary | ICD-10-CM | POA: Diagnosis not present

## 2020-05-28 DIAGNOSIS — R262 Difficulty in walking, not elsewhere classified: Secondary | ICD-10-CM | POA: Diagnosis not present

## 2020-05-28 DIAGNOSIS — R1312 Dysphagia, oropharyngeal phase: Secondary | ICD-10-CM | POA: Diagnosis not present

## 2020-05-28 DIAGNOSIS — R41841 Cognitive communication deficit: Secondary | ICD-10-CM | POA: Diagnosis not present

## 2020-05-28 DIAGNOSIS — E1121 Type 2 diabetes mellitus with diabetic nephropathy: Secondary | ICD-10-CM | POA: Diagnosis not present

## 2020-05-28 DIAGNOSIS — M6281 Muscle weakness (generalized): Secondary | ICD-10-CM | POA: Diagnosis not present

## 2020-05-31 DIAGNOSIS — R262 Difficulty in walking, not elsewhere classified: Secondary | ICD-10-CM | POA: Diagnosis not present

## 2020-05-31 DIAGNOSIS — M6281 Muscle weakness (generalized): Secondary | ICD-10-CM | POA: Diagnosis not present

## 2020-05-31 DIAGNOSIS — R1312 Dysphagia, oropharyngeal phase: Secondary | ICD-10-CM | POA: Diagnosis not present

## 2020-05-31 DIAGNOSIS — E1121 Type 2 diabetes mellitus with diabetic nephropathy: Secondary | ICD-10-CM | POA: Diagnosis not present

## 2020-05-31 DIAGNOSIS — R41841 Cognitive communication deficit: Secondary | ICD-10-CM | POA: Diagnosis not present

## 2020-06-01 DIAGNOSIS — R1312 Dysphagia, oropharyngeal phase: Secondary | ICD-10-CM | POA: Diagnosis not present

## 2020-06-01 DIAGNOSIS — Z961 Presence of intraocular lens: Secondary | ICD-10-CM | POA: Diagnosis not present

## 2020-06-01 DIAGNOSIS — H524 Presbyopia: Secondary | ICD-10-CM | POA: Diagnosis not present

## 2020-06-01 DIAGNOSIS — R41841 Cognitive communication deficit: Secondary | ICD-10-CM | POA: Diagnosis not present

## 2020-06-01 DIAGNOSIS — E1121 Type 2 diabetes mellitus with diabetic nephropathy: Secondary | ICD-10-CM | POA: Diagnosis not present

## 2020-06-01 DIAGNOSIS — E119 Type 2 diabetes mellitus without complications: Secondary | ICD-10-CM | POA: Diagnosis not present

## 2020-06-01 DIAGNOSIS — R262 Difficulty in walking, not elsewhere classified: Secondary | ICD-10-CM | POA: Diagnosis not present

## 2020-06-01 DIAGNOSIS — M6281 Muscle weakness (generalized): Secondary | ICD-10-CM | POA: Diagnosis not present

## 2020-06-02 DIAGNOSIS — R41841 Cognitive communication deficit: Secondary | ICD-10-CM | POA: Diagnosis not present

## 2020-06-02 DIAGNOSIS — M6281 Muscle weakness (generalized): Secondary | ICD-10-CM | POA: Diagnosis not present

## 2020-06-02 DIAGNOSIS — E1121 Type 2 diabetes mellitus with diabetic nephropathy: Secondary | ICD-10-CM | POA: Diagnosis not present

## 2020-06-02 DIAGNOSIS — R1312 Dysphagia, oropharyngeal phase: Secondary | ICD-10-CM | POA: Diagnosis not present

## 2020-06-02 DIAGNOSIS — R262 Difficulty in walking, not elsewhere classified: Secondary | ICD-10-CM | POA: Diagnosis not present

## 2020-06-03 DIAGNOSIS — R262 Difficulty in walking, not elsewhere classified: Secondary | ICD-10-CM | POA: Diagnosis not present

## 2020-06-03 DIAGNOSIS — R41841 Cognitive communication deficit: Secondary | ICD-10-CM | POA: Diagnosis not present

## 2020-06-03 DIAGNOSIS — E1121 Type 2 diabetes mellitus with diabetic nephropathy: Secondary | ICD-10-CM | POA: Diagnosis not present

## 2020-06-03 DIAGNOSIS — R1312 Dysphagia, oropharyngeal phase: Secondary | ICD-10-CM | POA: Diagnosis not present

## 2020-06-03 DIAGNOSIS — M6281 Muscle weakness (generalized): Secondary | ICD-10-CM | POA: Diagnosis not present

## 2020-06-04 DIAGNOSIS — R262 Difficulty in walking, not elsewhere classified: Secondary | ICD-10-CM | POA: Diagnosis not present

## 2020-06-04 DIAGNOSIS — R41841 Cognitive communication deficit: Secondary | ICD-10-CM | POA: Diagnosis not present

## 2020-06-04 DIAGNOSIS — R1312 Dysphagia, oropharyngeal phase: Secondary | ICD-10-CM | POA: Diagnosis not present

## 2020-06-04 DIAGNOSIS — M6281 Muscle weakness (generalized): Secondary | ICD-10-CM | POA: Diagnosis not present

## 2020-06-04 DIAGNOSIS — E1121 Type 2 diabetes mellitus with diabetic nephropathy: Secondary | ICD-10-CM | POA: Diagnosis not present

## 2020-06-07 DIAGNOSIS — M6281 Muscle weakness (generalized): Secondary | ICD-10-CM | POA: Diagnosis not present

## 2020-06-07 DIAGNOSIS — R1312 Dysphagia, oropharyngeal phase: Secondary | ICD-10-CM | POA: Diagnosis not present

## 2020-06-07 DIAGNOSIS — R262 Difficulty in walking, not elsewhere classified: Secondary | ICD-10-CM | POA: Diagnosis not present

## 2020-06-07 DIAGNOSIS — E1121 Type 2 diabetes mellitus with diabetic nephropathy: Secondary | ICD-10-CM | POA: Diagnosis not present

## 2020-06-07 DIAGNOSIS — R41841 Cognitive communication deficit: Secondary | ICD-10-CM | POA: Diagnosis not present

## 2020-06-08 DIAGNOSIS — R41841 Cognitive communication deficit: Secondary | ICD-10-CM | POA: Diagnosis not present

## 2020-06-08 DIAGNOSIS — M2042 Other hammer toe(s) (acquired), left foot: Secondary | ICD-10-CM | POA: Diagnosis not present

## 2020-06-08 DIAGNOSIS — E1121 Type 2 diabetes mellitus with diabetic nephropathy: Secondary | ICD-10-CM | POA: Diagnosis not present

## 2020-06-08 DIAGNOSIS — M2041 Other hammer toe(s) (acquired), right foot: Secondary | ICD-10-CM | POA: Diagnosis not present

## 2020-06-08 DIAGNOSIS — E1159 Type 2 diabetes mellitus with other circulatory complications: Secondary | ICD-10-CM | POA: Diagnosis not present

## 2020-06-08 DIAGNOSIS — M2012 Hallux valgus (acquired), left foot: Secondary | ICD-10-CM | POA: Diagnosis not present

## 2020-06-08 DIAGNOSIS — R262 Difficulty in walking, not elsewhere classified: Secondary | ICD-10-CM | POA: Diagnosis not present

## 2020-06-08 DIAGNOSIS — B351 Tinea unguium: Secondary | ICD-10-CM | POA: Diagnosis not present

## 2020-06-08 DIAGNOSIS — M6281 Muscle weakness (generalized): Secondary | ICD-10-CM | POA: Diagnosis not present

## 2020-06-08 DIAGNOSIS — R1312 Dysphagia, oropharyngeal phase: Secondary | ICD-10-CM | POA: Diagnosis not present

## 2020-06-08 DIAGNOSIS — L603 Nail dystrophy: Secondary | ICD-10-CM | POA: Diagnosis not present

## 2020-06-09 DIAGNOSIS — M6281 Muscle weakness (generalized): Secondary | ICD-10-CM | POA: Diagnosis not present

## 2020-06-09 DIAGNOSIS — R41841 Cognitive communication deficit: Secondary | ICD-10-CM | POA: Diagnosis not present

## 2020-06-09 DIAGNOSIS — R262 Difficulty in walking, not elsewhere classified: Secondary | ICD-10-CM | POA: Diagnosis not present

## 2020-06-09 DIAGNOSIS — R1312 Dysphagia, oropharyngeal phase: Secondary | ICD-10-CM | POA: Diagnosis not present

## 2020-06-09 DIAGNOSIS — E1121 Type 2 diabetes mellitus with diabetic nephropathy: Secondary | ICD-10-CM | POA: Diagnosis not present

## 2020-06-10 DIAGNOSIS — M6281 Muscle weakness (generalized): Secondary | ICD-10-CM | POA: Diagnosis not present

## 2020-06-10 DIAGNOSIS — R1312 Dysphagia, oropharyngeal phase: Secondary | ICD-10-CM | POA: Diagnosis not present

## 2020-06-10 DIAGNOSIS — R41841 Cognitive communication deficit: Secondary | ICD-10-CM | POA: Diagnosis not present

## 2020-06-10 DIAGNOSIS — E1121 Type 2 diabetes mellitus with diabetic nephropathy: Secondary | ICD-10-CM | POA: Diagnosis not present

## 2020-06-10 DIAGNOSIS — R262 Difficulty in walking, not elsewhere classified: Secondary | ICD-10-CM | POA: Diagnosis not present

## 2020-06-11 DIAGNOSIS — R262 Difficulty in walking, not elsewhere classified: Secondary | ICD-10-CM | POA: Diagnosis not present

## 2020-06-11 DIAGNOSIS — M6281 Muscle weakness (generalized): Secondary | ICD-10-CM | POA: Diagnosis not present

## 2020-06-11 DIAGNOSIS — R1312 Dysphagia, oropharyngeal phase: Secondary | ICD-10-CM | POA: Diagnosis not present

## 2020-06-11 DIAGNOSIS — E1121 Type 2 diabetes mellitus with diabetic nephropathy: Secondary | ICD-10-CM | POA: Diagnosis not present

## 2020-06-11 DIAGNOSIS — R41841 Cognitive communication deficit: Secondary | ICD-10-CM | POA: Diagnosis not present

## 2020-06-14 DIAGNOSIS — E1121 Type 2 diabetes mellitus with diabetic nephropathy: Secondary | ICD-10-CM | POA: Diagnosis not present

## 2020-06-14 DIAGNOSIS — R41841 Cognitive communication deficit: Secondary | ICD-10-CM | POA: Diagnosis not present

## 2020-06-14 DIAGNOSIS — R1312 Dysphagia, oropharyngeal phase: Secondary | ICD-10-CM | POA: Diagnosis not present

## 2020-06-14 DIAGNOSIS — M6281 Muscle weakness (generalized): Secondary | ICD-10-CM | POA: Diagnosis not present

## 2020-06-14 DIAGNOSIS — R262 Difficulty in walking, not elsewhere classified: Secondary | ICD-10-CM | POA: Diagnosis not present

## 2020-06-15 DIAGNOSIS — R41841 Cognitive communication deficit: Secondary | ICD-10-CM | POA: Diagnosis not present

## 2020-06-15 DIAGNOSIS — E1121 Type 2 diabetes mellitus with diabetic nephropathy: Secondary | ICD-10-CM | POA: Diagnosis not present

## 2020-06-15 DIAGNOSIS — R262 Difficulty in walking, not elsewhere classified: Secondary | ICD-10-CM | POA: Diagnosis not present

## 2020-06-15 DIAGNOSIS — M6281 Muscle weakness (generalized): Secondary | ICD-10-CM | POA: Diagnosis not present

## 2020-06-15 DIAGNOSIS — R1312 Dysphagia, oropharyngeal phase: Secondary | ICD-10-CM | POA: Diagnosis not present

## 2020-06-16 DIAGNOSIS — R1312 Dysphagia, oropharyngeal phase: Secondary | ICD-10-CM | POA: Diagnosis not present

## 2020-06-16 DIAGNOSIS — R262 Difficulty in walking, not elsewhere classified: Secondary | ICD-10-CM | POA: Diagnosis not present

## 2020-06-16 DIAGNOSIS — R41841 Cognitive communication deficit: Secondary | ICD-10-CM | POA: Diagnosis not present

## 2020-06-16 DIAGNOSIS — M6281 Muscle weakness (generalized): Secondary | ICD-10-CM | POA: Diagnosis not present

## 2020-06-16 DIAGNOSIS — E1121 Type 2 diabetes mellitus with diabetic nephropathy: Secondary | ICD-10-CM | POA: Diagnosis not present

## 2020-06-17 DIAGNOSIS — F29 Unspecified psychosis not due to a substance or known physiological condition: Secondary | ICD-10-CM | POA: Diagnosis not present

## 2020-06-17 DIAGNOSIS — E1121 Type 2 diabetes mellitus with diabetic nephropathy: Secondary | ICD-10-CM | POA: Diagnosis not present

## 2020-06-17 DIAGNOSIS — R1312 Dysphagia, oropharyngeal phase: Secondary | ICD-10-CM | POA: Diagnosis not present

## 2020-06-17 DIAGNOSIS — R262 Difficulty in walking, not elsewhere classified: Secondary | ICD-10-CM | POA: Diagnosis not present

## 2020-06-17 DIAGNOSIS — G300 Alzheimer's disease with early onset: Secondary | ICD-10-CM | POA: Diagnosis not present

## 2020-06-17 DIAGNOSIS — M6281 Muscle weakness (generalized): Secondary | ICD-10-CM | POA: Diagnosis not present

## 2020-06-17 DIAGNOSIS — R41841 Cognitive communication deficit: Secondary | ICD-10-CM | POA: Diagnosis not present

## 2020-06-17 DIAGNOSIS — F5101 Primary insomnia: Secondary | ICD-10-CM | POA: Diagnosis not present

## 2020-06-18 DIAGNOSIS — R262 Difficulty in walking, not elsewhere classified: Secondary | ICD-10-CM | POA: Diagnosis not present

## 2020-06-18 DIAGNOSIS — R1312 Dysphagia, oropharyngeal phase: Secondary | ICD-10-CM | POA: Diagnosis not present

## 2020-06-18 DIAGNOSIS — E1121 Type 2 diabetes mellitus with diabetic nephropathy: Secondary | ICD-10-CM | POA: Diagnosis not present

## 2020-06-18 DIAGNOSIS — R41841 Cognitive communication deficit: Secondary | ICD-10-CM | POA: Diagnosis not present

## 2020-06-18 DIAGNOSIS — M6281 Muscle weakness (generalized): Secondary | ICD-10-CM | POA: Diagnosis not present

## 2020-06-21 DIAGNOSIS — M6281 Muscle weakness (generalized): Secondary | ICD-10-CM | POA: Diagnosis not present

## 2020-06-21 DIAGNOSIS — R262 Difficulty in walking, not elsewhere classified: Secondary | ICD-10-CM | POA: Diagnosis not present

## 2020-06-21 DIAGNOSIS — E1121 Type 2 diabetes mellitus with diabetic nephropathy: Secondary | ICD-10-CM | POA: Diagnosis not present

## 2020-06-21 DIAGNOSIS — R41841 Cognitive communication deficit: Secondary | ICD-10-CM | POA: Diagnosis not present

## 2020-06-21 DIAGNOSIS — R1312 Dysphagia, oropharyngeal phase: Secondary | ICD-10-CM | POA: Diagnosis not present

## 2020-06-22 DIAGNOSIS — M6281 Muscle weakness (generalized): Secondary | ICD-10-CM | POA: Diagnosis not present

## 2020-06-22 DIAGNOSIS — R41841 Cognitive communication deficit: Secondary | ICD-10-CM | POA: Diagnosis not present

## 2020-06-22 DIAGNOSIS — R262 Difficulty in walking, not elsewhere classified: Secondary | ICD-10-CM | POA: Diagnosis not present

## 2020-06-22 DIAGNOSIS — R1312 Dysphagia, oropharyngeal phase: Secondary | ICD-10-CM | POA: Diagnosis not present

## 2020-06-22 DIAGNOSIS — E1121 Type 2 diabetes mellitus with diabetic nephropathy: Secondary | ICD-10-CM | POA: Diagnosis not present

## 2020-06-23 DIAGNOSIS — E1121 Type 2 diabetes mellitus with diabetic nephropathy: Secondary | ICD-10-CM | POA: Diagnosis not present

## 2020-06-23 DIAGNOSIS — R262 Difficulty in walking, not elsewhere classified: Secondary | ICD-10-CM | POA: Diagnosis not present

## 2020-06-23 DIAGNOSIS — R41841 Cognitive communication deficit: Secondary | ICD-10-CM | POA: Diagnosis not present

## 2020-06-23 DIAGNOSIS — R1312 Dysphagia, oropharyngeal phase: Secondary | ICD-10-CM | POA: Diagnosis not present

## 2020-06-23 DIAGNOSIS — M6281 Muscle weakness (generalized): Secondary | ICD-10-CM | POA: Diagnosis not present

## 2020-06-24 DIAGNOSIS — F29 Unspecified psychosis not due to a substance or known physiological condition: Secondary | ICD-10-CM | POA: Diagnosis not present

## 2020-06-24 DIAGNOSIS — F5101 Primary insomnia: Secondary | ICD-10-CM | POA: Diagnosis not present

## 2020-06-24 DIAGNOSIS — G300 Alzheimer's disease with early onset: Secondary | ICD-10-CM | POA: Diagnosis not present

## 2020-06-28 DIAGNOSIS — R946 Abnormal results of thyroid function studies: Secondary | ICD-10-CM | POA: Diagnosis not present

## 2020-06-29 DIAGNOSIS — I6789 Other cerebrovascular disease: Secondary | ICD-10-CM | POA: Diagnosis not present

## 2020-06-29 DIAGNOSIS — G309 Alzheimer's disease, unspecified: Secondary | ICD-10-CM | POA: Diagnosis not present

## 2020-06-29 DIAGNOSIS — R410 Disorientation, unspecified: Secondary | ICD-10-CM | POA: Diagnosis not present

## 2020-06-29 DIAGNOSIS — E876 Hypokalemia: Secondary | ICD-10-CM | POA: Diagnosis not present

## 2020-06-29 DIAGNOSIS — R4182 Altered mental status, unspecified: Secondary | ICD-10-CM | POA: Diagnosis not present

## 2020-06-29 DIAGNOSIS — I10 Essential (primary) hypertension: Secondary | ICD-10-CM | POA: Diagnosis not present

## 2020-06-29 DIAGNOSIS — E039 Hypothyroidism, unspecified: Secondary | ICD-10-CM | POA: Diagnosis not present

## 2020-06-29 DIAGNOSIS — E860 Dehydration: Secondary | ICD-10-CM | POA: Diagnosis not present

## 2020-06-29 DIAGNOSIS — E11649 Type 2 diabetes mellitus with hypoglycemia without coma: Secondary | ICD-10-CM | POA: Diagnosis not present

## 2020-06-29 DIAGNOSIS — E162 Hypoglycemia, unspecified: Secondary | ICD-10-CM | POA: Diagnosis not present

## 2020-06-29 DIAGNOSIS — E785 Hyperlipidemia, unspecified: Secondary | ICD-10-CM | POA: Diagnosis not present

## 2020-06-29 DIAGNOSIS — Z79899 Other long term (current) drug therapy: Secondary | ICD-10-CM | POA: Diagnosis not present

## 2020-06-29 DIAGNOSIS — G4700 Insomnia, unspecified: Secondary | ICD-10-CM | POA: Diagnosis not present

## 2020-06-29 DIAGNOSIS — F0280 Dementia in other diseases classified elsewhere without behavioral disturbance: Secondary | ICD-10-CM | POA: Diagnosis not present

## 2020-06-29 DIAGNOSIS — E1165 Type 2 diabetes mellitus with hyperglycemia: Secondary | ICD-10-CM | POA: Diagnosis not present

## 2020-06-29 DIAGNOSIS — R824 Acetonuria: Secondary | ICD-10-CM | POA: Diagnosis not present

## 2020-06-29 DIAGNOSIS — E161 Other hypoglycemia: Secondary | ICD-10-CM | POA: Diagnosis not present

## 2020-06-29 DIAGNOSIS — R404 Transient alteration of awareness: Secondary | ICD-10-CM | POA: Diagnosis not present

## 2020-07-02 DIAGNOSIS — E039 Hypothyroidism, unspecified: Secondary | ICD-10-CM | POA: Diagnosis not present

## 2020-07-02 DIAGNOSIS — G4700 Insomnia, unspecified: Secondary | ICD-10-CM | POA: Diagnosis not present

## 2020-07-02 DIAGNOSIS — E1121 Type 2 diabetes mellitus with diabetic nephropathy: Secondary | ICD-10-CM | POA: Diagnosis not present

## 2020-07-02 DIAGNOSIS — F0280 Dementia in other diseases classified elsewhere without behavioral disturbance: Secondary | ICD-10-CM | POA: Diagnosis not present

## 2020-07-02 DIAGNOSIS — R600 Localized edema: Secondary | ICD-10-CM | POA: Diagnosis not present

## 2020-07-02 DIAGNOSIS — E785 Hyperlipidemia, unspecified: Secondary | ICD-10-CM | POA: Diagnosis not present

## 2020-07-02 DIAGNOSIS — G309 Alzheimer's disease, unspecified: Secondary | ICD-10-CM | POA: Diagnosis not present

## 2020-07-02 DIAGNOSIS — I10 Essential (primary) hypertension: Secondary | ICD-10-CM | POA: Diagnosis not present

## 2020-07-28 DIAGNOSIS — R1310 Dysphagia, unspecified: Secondary | ICD-10-CM | POA: Diagnosis not present

## 2020-07-29 DIAGNOSIS — G301 Alzheimer's disease with late onset: Secondary | ICD-10-CM | POA: Diagnosis not present

## 2020-07-29 DIAGNOSIS — Z515 Encounter for palliative care: Secondary | ICD-10-CM | POA: Diagnosis not present

## 2020-07-29 DIAGNOSIS — F0281 Dementia in other diseases classified elsewhere with behavioral disturbance: Secondary | ICD-10-CM | POA: Diagnosis not present

## 2020-07-30 DIAGNOSIS — F5101 Primary insomnia: Secondary | ICD-10-CM | POA: Diagnosis not present

## 2020-07-30 DIAGNOSIS — F29 Unspecified psychosis not due to a substance or known physiological condition: Secondary | ICD-10-CM | POA: Diagnosis not present

## 2020-07-30 DIAGNOSIS — G300 Alzheimer's disease with early onset: Secondary | ICD-10-CM | POA: Diagnosis not present

## 2020-08-03 DIAGNOSIS — E039 Hypothyroidism, unspecified: Secondary | ICD-10-CM | POA: Diagnosis not present

## 2020-08-03 DIAGNOSIS — E1121 Type 2 diabetes mellitus with diabetic nephropathy: Secondary | ICD-10-CM | POA: Diagnosis not present

## 2020-08-03 DIAGNOSIS — I10 Essential (primary) hypertension: Secondary | ICD-10-CM | POA: Diagnosis not present

## 2020-08-03 DIAGNOSIS — F0280 Dementia in other diseases classified elsewhere without behavioral disturbance: Secondary | ICD-10-CM | POA: Diagnosis not present

## 2020-08-10 DIAGNOSIS — Z7982 Long term (current) use of aspirin: Secondary | ICD-10-CM | POA: Diagnosis not present

## 2020-08-10 DIAGNOSIS — E119 Type 2 diabetes mellitus without complications: Secondary | ICD-10-CM | POA: Diagnosis not present

## 2020-08-10 DIAGNOSIS — Z743 Need for continuous supervision: Secondary | ICD-10-CM | POA: Diagnosis not present

## 2020-08-10 DIAGNOSIS — E162 Hypoglycemia, unspecified: Secondary | ICD-10-CM | POA: Diagnosis not present

## 2020-08-10 DIAGNOSIS — R410 Disorientation, unspecified: Secondary | ICD-10-CM | POA: Diagnosis not present

## 2020-08-10 DIAGNOSIS — Z79899 Other long term (current) drug therapy: Secondary | ICD-10-CM | POA: Diagnosis not present

## 2020-08-10 DIAGNOSIS — Z794 Long term (current) use of insulin: Secondary | ICD-10-CM | POA: Diagnosis not present

## 2020-08-10 DIAGNOSIS — Z885 Allergy status to narcotic agent status: Secondary | ICD-10-CM | POA: Diagnosis not present

## 2020-08-10 DIAGNOSIS — G252 Other specified forms of tremor: Secondary | ICD-10-CM | POA: Diagnosis not present

## 2020-08-10 DIAGNOSIS — I10 Essential (primary) hypertension: Secondary | ICD-10-CM | POA: Diagnosis not present

## 2020-08-10 DIAGNOSIS — G319 Degenerative disease of nervous system, unspecified: Secondary | ICD-10-CM | POA: Diagnosis not present

## 2020-08-10 DIAGNOSIS — R404 Transient alteration of awareness: Secondary | ICD-10-CM | POA: Diagnosis not present

## 2020-08-10 DIAGNOSIS — R279 Unspecified lack of coordination: Secondary | ICD-10-CM | POA: Diagnosis not present

## 2020-08-10 DIAGNOSIS — R251 Tremor, unspecified: Secondary | ICD-10-CM | POA: Diagnosis not present

## 2020-08-10 DIAGNOSIS — N390 Urinary tract infection, site not specified: Secondary | ICD-10-CM | POA: Diagnosis not present

## 2020-08-10 DIAGNOSIS — E079 Disorder of thyroid, unspecified: Secondary | ICD-10-CM | POA: Diagnosis not present

## 2020-08-10 DIAGNOSIS — Z7984 Long term (current) use of oral hypoglycemic drugs: Secondary | ICD-10-CM | POA: Diagnosis not present

## 2020-08-12 DIAGNOSIS — I10 Essential (primary) hypertension: Secondary | ICD-10-CM | POA: Diagnosis not present

## 2020-08-12 DIAGNOSIS — E118 Type 2 diabetes mellitus with unspecified complications: Secondary | ICD-10-CM | POA: Diagnosis not present

## 2020-08-12 DIAGNOSIS — G309 Alzheimer's disease, unspecified: Secondary | ICD-10-CM | POA: Diagnosis not present

## 2020-08-12 DIAGNOSIS — E039 Hypothyroidism, unspecified: Secondary | ICD-10-CM | POA: Diagnosis not present

## 2020-08-12 DIAGNOSIS — E46 Unspecified protein-calorie malnutrition: Secondary | ICD-10-CM | POA: Diagnosis not present

## 2020-08-12 DIAGNOSIS — E785 Hyperlipidemia, unspecified: Secondary | ICD-10-CM | POA: Diagnosis not present

## 2020-08-12 DIAGNOSIS — R630 Anorexia: Secondary | ICD-10-CM | POA: Diagnosis not present

## 2020-08-12 DIAGNOSIS — R531 Weakness: Secondary | ICD-10-CM | POA: Diagnosis not present

## 2020-08-12 DIAGNOSIS — N3946 Mixed incontinence: Secondary | ICD-10-CM | POA: Diagnosis not present

## 2020-08-12 DIAGNOSIS — R634 Abnormal weight loss: Secondary | ICD-10-CM | POA: Diagnosis not present

## 2020-08-12 DIAGNOSIS — R1310 Dysphagia, unspecified: Secondary | ICD-10-CM | POA: Diagnosis not present

## 2020-08-13 DIAGNOSIS — E785 Hyperlipidemia, unspecified: Secondary | ICD-10-CM | POA: Diagnosis not present

## 2020-08-13 DIAGNOSIS — G309 Alzheimer's disease, unspecified: Secondary | ICD-10-CM | POA: Diagnosis not present

## 2020-08-13 DIAGNOSIS — E039 Hypothyroidism, unspecified: Secondary | ICD-10-CM | POA: Diagnosis not present

## 2020-08-13 DIAGNOSIS — E118 Type 2 diabetes mellitus with unspecified complications: Secondary | ICD-10-CM | POA: Diagnosis not present

## 2020-08-13 DIAGNOSIS — E46 Unspecified protein-calorie malnutrition: Secondary | ICD-10-CM | POA: Diagnosis not present

## 2020-08-13 DIAGNOSIS — I10 Essential (primary) hypertension: Secondary | ICD-10-CM | POA: Diagnosis not present

## 2020-08-16 DIAGNOSIS — G309 Alzheimer's disease, unspecified: Secondary | ICD-10-CM | POA: Diagnosis not present

## 2020-08-16 DIAGNOSIS — E118 Type 2 diabetes mellitus with unspecified complications: Secondary | ICD-10-CM | POA: Diagnosis not present

## 2020-08-16 DIAGNOSIS — E46 Unspecified protein-calorie malnutrition: Secondary | ICD-10-CM | POA: Diagnosis not present

## 2020-08-16 DIAGNOSIS — E785 Hyperlipidemia, unspecified: Secondary | ICD-10-CM | POA: Diagnosis not present

## 2020-08-16 DIAGNOSIS — E039 Hypothyroidism, unspecified: Secondary | ICD-10-CM | POA: Diagnosis not present

## 2020-08-16 DIAGNOSIS — I10 Essential (primary) hypertension: Secondary | ICD-10-CM | POA: Diagnosis not present

## 2020-08-17 DIAGNOSIS — E039 Hypothyroidism, unspecified: Secondary | ICD-10-CM | POA: Diagnosis not present

## 2020-08-17 DIAGNOSIS — I10 Essential (primary) hypertension: Secondary | ICD-10-CM | POA: Diagnosis not present

## 2020-08-17 DIAGNOSIS — G309 Alzheimer's disease, unspecified: Secondary | ICD-10-CM | POA: Diagnosis not present

## 2020-08-17 DIAGNOSIS — E46 Unspecified protein-calorie malnutrition: Secondary | ICD-10-CM | POA: Diagnosis not present

## 2020-08-17 DIAGNOSIS — E118 Type 2 diabetes mellitus with unspecified complications: Secondary | ICD-10-CM | POA: Diagnosis not present

## 2020-08-17 DIAGNOSIS — E785 Hyperlipidemia, unspecified: Secondary | ICD-10-CM | POA: Diagnosis not present

## 2020-08-18 DIAGNOSIS — B351 Tinea unguium: Secondary | ICD-10-CM | POA: Diagnosis not present

## 2020-08-18 DIAGNOSIS — E1159 Type 2 diabetes mellitus with other circulatory complications: Secondary | ICD-10-CM | POA: Diagnosis not present

## 2020-08-19 DIAGNOSIS — G309 Alzheimer's disease, unspecified: Secondary | ICD-10-CM | POA: Diagnosis not present

## 2020-08-19 DIAGNOSIS — E785 Hyperlipidemia, unspecified: Secondary | ICD-10-CM | POA: Diagnosis not present

## 2020-08-19 DIAGNOSIS — E039 Hypothyroidism, unspecified: Secondary | ICD-10-CM | POA: Diagnosis not present

## 2020-08-19 DIAGNOSIS — E46 Unspecified protein-calorie malnutrition: Secondary | ICD-10-CM | POA: Diagnosis not present

## 2020-08-19 DIAGNOSIS — E118 Type 2 diabetes mellitus with unspecified complications: Secondary | ICD-10-CM | POA: Diagnosis not present

## 2020-08-19 DIAGNOSIS — I10 Essential (primary) hypertension: Secondary | ICD-10-CM | POA: Diagnosis not present

## 2020-08-23 DIAGNOSIS — E785 Hyperlipidemia, unspecified: Secondary | ICD-10-CM | POA: Diagnosis not present

## 2020-08-23 DIAGNOSIS — E039 Hypothyroidism, unspecified: Secondary | ICD-10-CM | POA: Diagnosis not present

## 2020-08-23 DIAGNOSIS — I10 Essential (primary) hypertension: Secondary | ICD-10-CM | POA: Diagnosis not present

## 2020-08-23 DIAGNOSIS — G309 Alzheimer's disease, unspecified: Secondary | ICD-10-CM | POA: Diagnosis not present

## 2020-08-23 DIAGNOSIS — E46 Unspecified protein-calorie malnutrition: Secondary | ICD-10-CM | POA: Diagnosis not present

## 2020-08-23 DIAGNOSIS — E118 Type 2 diabetes mellitus with unspecified complications: Secondary | ICD-10-CM | POA: Diagnosis not present

## 2020-08-24 DIAGNOSIS — E46 Unspecified protein-calorie malnutrition: Secondary | ICD-10-CM | POA: Diagnosis not present

## 2020-08-24 DIAGNOSIS — E785 Hyperlipidemia, unspecified: Secondary | ICD-10-CM | POA: Diagnosis not present

## 2020-08-24 DIAGNOSIS — E039 Hypothyroidism, unspecified: Secondary | ICD-10-CM | POA: Diagnosis not present

## 2020-08-24 DIAGNOSIS — E118 Type 2 diabetes mellitus with unspecified complications: Secondary | ICD-10-CM | POA: Diagnosis not present

## 2020-08-24 DIAGNOSIS — G309 Alzheimer's disease, unspecified: Secondary | ICD-10-CM | POA: Diagnosis not present

## 2020-08-24 DIAGNOSIS — I10 Essential (primary) hypertension: Secondary | ICD-10-CM | POA: Diagnosis not present

## 2020-08-26 DIAGNOSIS — E118 Type 2 diabetes mellitus with unspecified complications: Secondary | ICD-10-CM | POA: Diagnosis not present

## 2020-08-26 DIAGNOSIS — E039 Hypothyroidism, unspecified: Secondary | ICD-10-CM | POA: Diagnosis not present

## 2020-08-26 DIAGNOSIS — G309 Alzheimer's disease, unspecified: Secondary | ICD-10-CM | POA: Diagnosis not present

## 2020-08-26 DIAGNOSIS — E46 Unspecified protein-calorie malnutrition: Secondary | ICD-10-CM | POA: Diagnosis not present

## 2020-08-26 DIAGNOSIS — E785 Hyperlipidemia, unspecified: Secondary | ICD-10-CM | POA: Diagnosis not present

## 2020-08-26 DIAGNOSIS — I10 Essential (primary) hypertension: Secondary | ICD-10-CM | POA: Diagnosis not present

## 2020-08-28 DIAGNOSIS — E785 Hyperlipidemia, unspecified: Secondary | ICD-10-CM | POA: Diagnosis not present

## 2020-08-28 DIAGNOSIS — E118 Type 2 diabetes mellitus with unspecified complications: Secondary | ICD-10-CM | POA: Diagnosis not present

## 2020-08-28 DIAGNOSIS — I10 Essential (primary) hypertension: Secondary | ICD-10-CM | POA: Diagnosis not present

## 2020-08-28 DIAGNOSIS — E039 Hypothyroidism, unspecified: Secondary | ICD-10-CM | POA: Diagnosis not present

## 2020-08-28 DIAGNOSIS — G309 Alzheimer's disease, unspecified: Secondary | ICD-10-CM | POA: Diagnosis not present

## 2020-08-28 DIAGNOSIS — E46 Unspecified protein-calorie malnutrition: Secondary | ICD-10-CM | POA: Diagnosis not present

## 2020-08-31 DIAGNOSIS — G309 Alzheimer's disease, unspecified: Secondary | ICD-10-CM | POA: Diagnosis not present

## 2020-08-31 DIAGNOSIS — E46 Unspecified protein-calorie malnutrition: Secondary | ICD-10-CM | POA: Diagnosis not present

## 2020-08-31 DIAGNOSIS — I10 Essential (primary) hypertension: Secondary | ICD-10-CM | POA: Diagnosis not present

## 2020-08-31 DIAGNOSIS — E039 Hypothyroidism, unspecified: Secondary | ICD-10-CM | POA: Diagnosis not present

## 2020-08-31 DIAGNOSIS — E118 Type 2 diabetes mellitus with unspecified complications: Secondary | ICD-10-CM | POA: Diagnosis not present

## 2020-08-31 DIAGNOSIS — E785 Hyperlipidemia, unspecified: Secondary | ICD-10-CM | POA: Diagnosis not present

## 2020-09-01 DIAGNOSIS — E46 Unspecified protein-calorie malnutrition: Secondary | ICD-10-CM | POA: Diagnosis not present

## 2020-09-01 DIAGNOSIS — E039 Hypothyroidism, unspecified: Secondary | ICD-10-CM | POA: Diagnosis not present

## 2020-09-01 DIAGNOSIS — E118 Type 2 diabetes mellitus with unspecified complications: Secondary | ICD-10-CM | POA: Diagnosis not present

## 2020-09-01 DIAGNOSIS — I10 Essential (primary) hypertension: Secondary | ICD-10-CM | POA: Diagnosis not present

## 2020-09-01 DIAGNOSIS — G309 Alzheimer's disease, unspecified: Secondary | ICD-10-CM | POA: Diagnosis not present

## 2020-09-01 DIAGNOSIS — E785 Hyperlipidemia, unspecified: Secondary | ICD-10-CM | POA: Diagnosis not present

## 2020-09-02 DIAGNOSIS — G4700 Insomnia, unspecified: Secondary | ICD-10-CM | POA: Diagnosis not present

## 2020-09-02 DIAGNOSIS — E1121 Type 2 diabetes mellitus with diabetic nephropathy: Secondary | ICD-10-CM | POA: Diagnosis not present

## 2020-09-02 DIAGNOSIS — I10 Essential (primary) hypertension: Secondary | ICD-10-CM | POA: Diagnosis not present

## 2020-09-02 DIAGNOSIS — Z515 Encounter for palliative care: Secondary | ICD-10-CM | POA: Diagnosis not present

## 2020-09-02 DIAGNOSIS — G309 Alzheimer's disease, unspecified: Secondary | ICD-10-CM | POA: Diagnosis not present

## 2020-09-02 DIAGNOSIS — E46 Unspecified protein-calorie malnutrition: Secondary | ICD-10-CM | POA: Diagnosis not present

## 2020-09-02 DIAGNOSIS — E119 Type 2 diabetes mellitus without complications: Secondary | ICD-10-CM | POA: Diagnosis not present

## 2020-09-02 DIAGNOSIS — E785 Hyperlipidemia, unspecified: Secondary | ICD-10-CM | POA: Diagnosis not present

## 2020-09-02 DIAGNOSIS — R251 Tremor, unspecified: Secondary | ICD-10-CM | POA: Diagnosis not present

## 2020-09-02 DIAGNOSIS — E039 Hypothyroidism, unspecified: Secondary | ICD-10-CM | POA: Diagnosis not present

## 2020-09-02 DIAGNOSIS — E118 Type 2 diabetes mellitus with unspecified complications: Secondary | ICD-10-CM | POA: Diagnosis not present

## 2020-09-04 DIAGNOSIS — E118 Type 2 diabetes mellitus with unspecified complications: Secondary | ICD-10-CM | POA: Diagnosis not present

## 2020-09-04 DIAGNOSIS — I10 Essential (primary) hypertension: Secondary | ICD-10-CM | POA: Diagnosis not present

## 2020-09-04 DIAGNOSIS — E785 Hyperlipidemia, unspecified: Secondary | ICD-10-CM | POA: Diagnosis not present

## 2020-09-04 DIAGNOSIS — E46 Unspecified protein-calorie malnutrition: Secondary | ICD-10-CM | POA: Diagnosis not present

## 2020-09-04 DIAGNOSIS — E039 Hypothyroidism, unspecified: Secondary | ICD-10-CM | POA: Diagnosis not present

## 2020-09-04 DIAGNOSIS — G309 Alzheimer's disease, unspecified: Secondary | ICD-10-CM | POA: Diagnosis not present

## 2020-09-07 DIAGNOSIS — E46 Unspecified protein-calorie malnutrition: Secondary | ICD-10-CM | POA: Diagnosis not present

## 2020-09-07 DIAGNOSIS — G309 Alzheimer's disease, unspecified: Secondary | ICD-10-CM | POA: Diagnosis not present

## 2020-09-07 DIAGNOSIS — I10 Essential (primary) hypertension: Secondary | ICD-10-CM | POA: Diagnosis not present

## 2020-09-07 DIAGNOSIS — E039 Hypothyroidism, unspecified: Secondary | ICD-10-CM | POA: Diagnosis not present

## 2020-09-07 DIAGNOSIS — E118 Type 2 diabetes mellitus with unspecified complications: Secondary | ICD-10-CM | POA: Diagnosis not present

## 2020-09-07 DIAGNOSIS — E785 Hyperlipidemia, unspecified: Secondary | ICD-10-CM | POA: Diagnosis not present

## 2020-09-08 DIAGNOSIS — E785 Hyperlipidemia, unspecified: Secondary | ICD-10-CM | POA: Diagnosis not present

## 2020-09-08 DIAGNOSIS — E46 Unspecified protein-calorie malnutrition: Secondary | ICD-10-CM | POA: Diagnosis not present

## 2020-09-08 DIAGNOSIS — E039 Hypothyroidism, unspecified: Secondary | ICD-10-CM | POA: Diagnosis not present

## 2020-09-08 DIAGNOSIS — G309 Alzheimer's disease, unspecified: Secondary | ICD-10-CM | POA: Diagnosis not present

## 2020-09-08 DIAGNOSIS — I10 Essential (primary) hypertension: Secondary | ICD-10-CM | POA: Diagnosis not present

## 2020-09-08 DIAGNOSIS — E118 Type 2 diabetes mellitus with unspecified complications: Secondary | ICD-10-CM | POA: Diagnosis not present

## 2020-09-09 DIAGNOSIS — E46 Unspecified protein-calorie malnutrition: Secondary | ICD-10-CM | POA: Diagnosis not present

## 2020-09-09 DIAGNOSIS — R634 Abnormal weight loss: Secondary | ICD-10-CM | POA: Diagnosis not present

## 2020-09-09 DIAGNOSIS — E039 Hypothyroidism, unspecified: Secondary | ICD-10-CM | POA: Diagnosis not present

## 2020-09-09 DIAGNOSIS — G309 Alzheimer's disease, unspecified: Secondary | ICD-10-CM | POA: Diagnosis not present

## 2020-09-09 DIAGNOSIS — E118 Type 2 diabetes mellitus with unspecified complications: Secondary | ICD-10-CM | POA: Diagnosis not present

## 2020-09-09 DIAGNOSIS — R1310 Dysphagia, unspecified: Secondary | ICD-10-CM | POA: Diagnosis not present

## 2020-09-09 DIAGNOSIS — R531 Weakness: Secondary | ICD-10-CM | POA: Diagnosis not present

## 2020-09-09 DIAGNOSIS — R630 Anorexia: Secondary | ICD-10-CM | POA: Diagnosis not present

## 2020-09-09 DIAGNOSIS — N3946 Mixed incontinence: Secondary | ICD-10-CM | POA: Diagnosis not present

## 2020-09-09 DIAGNOSIS — E785 Hyperlipidemia, unspecified: Secondary | ICD-10-CM | POA: Diagnosis not present

## 2020-09-09 DIAGNOSIS — I10 Essential (primary) hypertension: Secondary | ICD-10-CM | POA: Diagnosis not present

## 2020-09-11 DIAGNOSIS — E039 Hypothyroidism, unspecified: Secondary | ICD-10-CM | POA: Diagnosis not present

## 2020-09-11 DIAGNOSIS — G309 Alzheimer's disease, unspecified: Secondary | ICD-10-CM | POA: Diagnosis not present

## 2020-09-11 DIAGNOSIS — I10 Essential (primary) hypertension: Secondary | ICD-10-CM | POA: Diagnosis not present

## 2020-09-11 DIAGNOSIS — E46 Unspecified protein-calorie malnutrition: Secondary | ICD-10-CM | POA: Diagnosis not present

## 2020-09-11 DIAGNOSIS — E785 Hyperlipidemia, unspecified: Secondary | ICD-10-CM | POA: Diagnosis not present

## 2020-09-11 DIAGNOSIS — E118 Type 2 diabetes mellitus with unspecified complications: Secondary | ICD-10-CM | POA: Diagnosis not present

## 2020-09-14 DIAGNOSIS — F5101 Primary insomnia: Secondary | ICD-10-CM | POA: Diagnosis not present

## 2020-09-14 DIAGNOSIS — E785 Hyperlipidemia, unspecified: Secondary | ICD-10-CM | POA: Diagnosis not present

## 2020-09-14 DIAGNOSIS — F29 Unspecified psychosis not due to a substance or known physiological condition: Secondary | ICD-10-CM | POA: Diagnosis not present

## 2020-09-14 DIAGNOSIS — G309 Alzheimer's disease, unspecified: Secondary | ICD-10-CM | POA: Diagnosis not present

## 2020-09-14 DIAGNOSIS — E118 Type 2 diabetes mellitus with unspecified complications: Secondary | ICD-10-CM | POA: Diagnosis not present

## 2020-09-14 DIAGNOSIS — E46 Unspecified protein-calorie malnutrition: Secondary | ICD-10-CM | POA: Diagnosis not present

## 2020-09-14 DIAGNOSIS — I10 Essential (primary) hypertension: Secondary | ICD-10-CM | POA: Diagnosis not present

## 2020-09-14 DIAGNOSIS — E039 Hypothyroidism, unspecified: Secondary | ICD-10-CM | POA: Diagnosis not present

## 2020-09-14 DIAGNOSIS — G300 Alzheimer's disease with early onset: Secondary | ICD-10-CM | POA: Diagnosis not present

## 2020-09-16 DIAGNOSIS — E46 Unspecified protein-calorie malnutrition: Secondary | ICD-10-CM | POA: Diagnosis not present

## 2020-09-16 DIAGNOSIS — I10 Essential (primary) hypertension: Secondary | ICD-10-CM | POA: Diagnosis not present

## 2020-09-16 DIAGNOSIS — G309 Alzheimer's disease, unspecified: Secondary | ICD-10-CM | POA: Diagnosis not present

## 2020-09-16 DIAGNOSIS — E785 Hyperlipidemia, unspecified: Secondary | ICD-10-CM | POA: Diagnosis not present

## 2020-09-16 DIAGNOSIS — E118 Type 2 diabetes mellitus with unspecified complications: Secondary | ICD-10-CM | POA: Diagnosis not present

## 2020-09-16 DIAGNOSIS — E039 Hypothyroidism, unspecified: Secondary | ICD-10-CM | POA: Diagnosis not present

## 2020-09-18 DIAGNOSIS — E039 Hypothyroidism, unspecified: Secondary | ICD-10-CM | POA: Diagnosis not present

## 2020-09-18 DIAGNOSIS — E46 Unspecified protein-calorie malnutrition: Secondary | ICD-10-CM | POA: Diagnosis not present

## 2020-09-18 DIAGNOSIS — G309 Alzheimer's disease, unspecified: Secondary | ICD-10-CM | POA: Diagnosis not present

## 2020-09-18 DIAGNOSIS — E118 Type 2 diabetes mellitus with unspecified complications: Secondary | ICD-10-CM | POA: Diagnosis not present

## 2020-09-18 DIAGNOSIS — E785 Hyperlipidemia, unspecified: Secondary | ICD-10-CM | POA: Diagnosis not present

## 2020-09-18 DIAGNOSIS — I10 Essential (primary) hypertension: Secondary | ICD-10-CM | POA: Diagnosis not present

## 2020-09-21 DIAGNOSIS — E039 Hypothyroidism, unspecified: Secondary | ICD-10-CM | POA: Diagnosis not present

## 2020-09-21 DIAGNOSIS — E785 Hyperlipidemia, unspecified: Secondary | ICD-10-CM | POA: Diagnosis not present

## 2020-09-21 DIAGNOSIS — E46 Unspecified protein-calorie malnutrition: Secondary | ICD-10-CM | POA: Diagnosis not present

## 2020-09-21 DIAGNOSIS — R739 Hyperglycemia, unspecified: Secondary | ICD-10-CM | POA: Diagnosis not present

## 2020-09-21 DIAGNOSIS — E118 Type 2 diabetes mellitus with unspecified complications: Secondary | ICD-10-CM | POA: Diagnosis not present

## 2020-09-21 DIAGNOSIS — G309 Alzheimer's disease, unspecified: Secondary | ICD-10-CM | POA: Diagnosis not present

## 2020-09-21 DIAGNOSIS — I10 Essential (primary) hypertension: Secondary | ICD-10-CM | POA: Diagnosis not present

## 2020-09-22 DIAGNOSIS — I10 Essential (primary) hypertension: Secondary | ICD-10-CM | POA: Diagnosis not present

## 2020-09-22 DIAGNOSIS — E785 Hyperlipidemia, unspecified: Secondary | ICD-10-CM | POA: Diagnosis not present

## 2020-09-22 DIAGNOSIS — Z515 Encounter for palliative care: Secondary | ICD-10-CM | POA: Diagnosis not present

## 2020-09-22 DIAGNOSIS — E039 Hypothyroidism, unspecified: Secondary | ICD-10-CM | POA: Diagnosis not present

## 2020-09-22 DIAGNOSIS — R251 Tremor, unspecified: Secondary | ICD-10-CM | POA: Diagnosis not present

## 2020-09-22 DIAGNOSIS — G309 Alzheimer's disease, unspecified: Secondary | ICD-10-CM | POA: Diagnosis not present

## 2020-09-22 DIAGNOSIS — G4700 Insomnia, unspecified: Secondary | ICD-10-CM | POA: Diagnosis not present

## 2020-09-22 DIAGNOSIS — F0280 Dementia in other diseases classified elsewhere without behavioral disturbance: Secondary | ICD-10-CM | POA: Diagnosis not present

## 2020-10-09 DEATH — deceased
